# Patient Record
Sex: Male | Born: 1960 | Race: White | Hispanic: No | Marital: Single | Smoking: Never smoker
Health system: Southern US, Community
[De-identification: ages and names within clinical notes are randomized; demographics above are authoritative.]

## PROBLEM LIST (undated history)

## (undated) DIAGNOSIS — S92353A Displaced fracture of fifth metatarsal bone, unspecified foot, initial encounter for closed fracture: Secondary | ICD-10-CM

## (undated) DIAGNOSIS — E785 Hyperlipidemia, unspecified: Secondary | ICD-10-CM

## (undated) HISTORY — PX: TYMPANOPLASTY W/ MASTOIDECTOMY: SUR1400

---

## 2007-07-02 ENCOUNTER — Ambulatory Visit: Payer: Self-pay | Admitting: Gastroenterology

## 2007-07-23 ENCOUNTER — Ambulatory Visit: Payer: Self-pay | Admitting: Gastroenterology

## 2011-04-07 ENCOUNTER — Other Ambulatory Visit: Payer: Self-pay | Admitting: Orthopedic Surgery

## 2011-04-16 MED ORDER — DIPHENHYDRAMINE HCL 50 MG/ML IJ SOLN
INTRAMUSCULAR | Status: AC
  Filled 2011-04-16: qty 1

## 2011-04-16 MED ORDER — MIDAZOLAM HCL 10 MG/2ML IJ SOLN
INTRAMUSCULAR | Status: AC
  Filled 2011-04-16: qty 2

## 2011-04-16 MED ORDER — FENTANYL CITRATE 0.05 MG/ML IJ SOLN
INTRAMUSCULAR | Status: AC
  Filled 2011-04-16: qty 2

## 2011-04-20 ENCOUNTER — Encounter (HOSPITAL_BASED_OUTPATIENT_CLINIC_OR_DEPARTMENT_OTHER): Payer: Self-pay | Admitting: *Deleted

## 2011-04-20 NOTE — Progress Notes (Signed)
To bring crutches-needs ekg if anesth wants No meds

## 2011-04-22 ENCOUNTER — Encounter (HOSPITAL_BASED_OUTPATIENT_CLINIC_OR_DEPARTMENT_OTHER): Payer: Self-pay | Admitting: Anesthesiology

## 2011-04-22 ENCOUNTER — Encounter (HOSPITAL_BASED_OUTPATIENT_CLINIC_OR_DEPARTMENT_OTHER)
Admission: RE | Disposition: A | Discharge status: Home / Self Care | Payer: Self-pay | Source: Ambulatory Visit | Attending: Orthopedic Surgery

## 2011-04-22 ENCOUNTER — Ambulatory Visit (HOSPITAL_BASED_OUTPATIENT_CLINIC_OR_DEPARTMENT_OTHER): Payer: PRIVATE HEALTH INSURANCE | Admitting: Anesthesiology

## 2011-04-22 ENCOUNTER — Encounter (HOSPITAL_BASED_OUTPATIENT_CLINIC_OR_DEPARTMENT_OTHER): Payer: Self-pay | Admitting: *Deleted

## 2011-04-22 ENCOUNTER — Ambulatory Visit (HOSPITAL_BASED_OUTPATIENT_CLINIC_OR_DEPARTMENT_OTHER)
Admission: RE | Admit: 2011-04-22 | Discharge: 2011-04-22 | Disposition: A | Discharge status: Home / Self Care | Payer: PRIVATE HEALTH INSURANCE | Source: Ambulatory Visit | Attending: Orthopedic Surgery | Admitting: Orthopedic Surgery

## 2011-04-22 DIAGNOSIS — IMO0002 Reserved for concepts with insufficient information to code with codable children: Secondary | ICD-10-CM | POA: Insufficient documentation

## 2011-04-22 DIAGNOSIS — Z01812 Encounter for preprocedural laboratory examination: Secondary | ICD-10-CM | POA: Insufficient documentation

## 2011-04-22 DIAGNOSIS — S92353A Displaced fracture of fifth metatarsal bone, unspecified foot, initial encounter for closed fracture: Secondary | ICD-10-CM | POA: Diagnosis present

## 2011-04-22 HISTORY — DX: Displaced fracture of fifth metatarsal bone, unspecified foot, initial encounter for closed fracture: S92.353A

## 2011-04-22 HISTORY — PX: ORIF TOE FRACTURE: SHX5032

## 2011-04-22 HISTORY — DX: Hyperlipidemia, unspecified: E78.5

## 2011-04-22 LAB — POCT HEMOGLOBIN-HEMACUE: Hemoglobin: 16 g/dL (ref 13.0–17.0)

## 2011-04-22 SURGERY — OPEN REDUCTION INTERNAL FIXATION (ORIF) METATARSAL (TOE) FRACTURE
Anesthesia: General | Site: Toe | Laterality: Right

## 2011-04-22 MED ORDER — PROMETHAZINE HCL 25 MG PO TABS: 25.0000 mg | ORAL_TABLET | Freq: Four times a day (QID) | ORAL | Status: AC | PRN

## 2011-04-22 MED ORDER — FENTANYL CITRATE 0.05 MG/ML IJ SOLN
INTRAMUSCULAR | Status: DC | PRN
  Administered 2011-04-22 (×3): 25 ug via INTRAVENOUS
  Administered 2011-04-22: 50 ug via INTRAVENOUS

## 2011-04-22 MED ORDER — ONDANSETRON HCL 4 MG/2ML IJ SOLN
INTRAMUSCULAR | Status: DC | PRN
  Administered 2011-04-22: 4 mg via INTRAVENOUS

## 2011-04-22 MED ORDER — DEXAMETHASONE SODIUM PHOSPHATE 4 MG/ML IJ SOLN
INTRAMUSCULAR | Status: DC | PRN
  Administered 2011-04-22: 10 mg via INTRAVENOUS

## 2011-04-22 MED ORDER — METHOCARBAMOL 500 MG PO TABS: 500.0000 mg | ORAL_TABLET | Freq: Four times a day (QID) | ORAL | Status: AC

## 2011-04-22 MED ORDER — HYDROMORPHONE HCL PF 1 MG/ML IJ SOLN: 0.2500 mg | INTRAMUSCULAR | Status: DC | PRN

## 2011-04-22 MED ORDER — LACTATED RINGERS IV SOLN
INTRAVENOUS | Status: DC
  Administered 2011-04-22 (×2): via INTRAVENOUS

## 2011-04-22 MED ORDER — PROPOFOL 10 MG/ML IV EMUL
INTRAVENOUS | Status: DC | PRN
  Administered 2011-04-22: 200 mg via INTRAVENOUS

## 2011-04-22 MED ORDER — DROPERIDOL 2.5 MG/ML IJ SOLN: 0.6250 mg | INTRAMUSCULAR | Status: DC | PRN

## 2011-04-22 MED ORDER — BUPIVACAINE HCL (PF) 0.5 % IJ SOLN
INTRAMUSCULAR | Status: DC | PRN
  Administered 2011-04-22: 30 mL

## 2011-04-22 MED ORDER — OXYCODONE-ACETAMINOPHEN 10-325 MG PO TABS: 1.0000 | ORAL_TABLET | Freq: Four times a day (QID) | ORAL | Status: AC | PRN

## 2011-04-22 MED ORDER — MIDAZOLAM HCL 5 MG/ML IJ SOLN: 2.0000 mg | Freq: Once | INTRAMUSCULAR | Status: DC

## 2011-04-22 MED ORDER — CHLORHEXIDINE GLUCONATE 4 % EX LIQD: 60.0000 mL | Freq: Once | CUTANEOUS | Status: DC

## 2011-04-22 MED ORDER — MIDAZOLAM HCL 2 MG/2ML IJ SOLN
2.0000 mg | Freq: Once | INTRAMUSCULAR | Status: AC
  Administered 2011-04-22: 2 mg via INTRAVENOUS

## 2011-04-22 MED ORDER — CEFAZOLIN SODIUM 1-5 GM-% IV SOLN
1.0000 g | INTRAVENOUS | Status: AC
  Administered 2011-04-22: 2 g via INTRAVENOUS

## 2011-04-22 MED ORDER — FENTANYL CITRATE 0.05 MG/ML IJ SOLN
100.0000 ug | Freq: Once | INTRAMUSCULAR | Status: AC
  Administered 2011-04-22: 100 ug via INTRAVENOUS

## 2011-04-22 SURGICAL SUPPLY — 79 items
1.8MM X 125MM DRILL BIT ×2 IMPLANT
APL SKNCLS STERI-STRIP NONHPOA (GAUZE/BANDAGES/DRESSINGS)
BANDAGE ACE 4 STERILE (GAUZE/BANDAGES/DRESSINGS) ×2 IMPLANT
BANDAGE ELASTIC 4 VELCRO ST LF (GAUZE/BANDAGES/DRESSINGS) ×2 IMPLANT
BANDAGE ELASTIC 6 VELCRO ST LF (GAUZE/BANDAGES/DRESSINGS) ×2 IMPLANT
BANDAGE ESMARK 6X9 LF (GAUZE/BANDAGES/DRESSINGS) ×1 IMPLANT
BENZOIN TINCTURE PRP APPL 2/3 (GAUZE/BANDAGES/DRESSINGS) IMPLANT
BLADE SURG 15 STRL LF DISP TIS (BLADE) ×3 IMPLANT
BLADE SURG 15 STRL SS (BLADE) ×6
BNDG CMPR 9X6 STRL LF SNTH (GAUZE/BANDAGES/DRESSINGS) ×1
BNDG ESMARK 6X9 LF (GAUZE/BANDAGES/DRESSINGS) ×2
CANISTER SUCTION 1200CC (MISCELLANEOUS) IMPLANT
CAP PIN ORTHO PINK (CAP) ×2 IMPLANT
CAP PIN PROTECTOR ORTHO WHT (CAP) ×2 IMPLANT
CLOTH BEACON ORANGE TIMEOUT ST (SAFETY) ×2 IMPLANT
CUFF TOURNIQUET SINGLE 34IN LL (TOURNIQUET CUFF) ×2 IMPLANT
DECANTER SPIKE VIAL GLASS SM (MISCELLANEOUS) IMPLANT
DRAPE EXTREMITY T 121X128X90 (DRAPE) ×2 IMPLANT
DRAPE OEC MINIVIEW 54X84 (DRAPES) ×4 IMPLANT
DRAPE U-SHAPE 47X51 STRL (DRAPES) ×2 IMPLANT
DRSG EMULSION OIL 3X3 NADH (GAUZE/BANDAGES/DRESSINGS) ×2 IMPLANT
DRSG PAD ABDOMINAL 8X10 ST (GAUZE/BANDAGES/DRESSINGS) ×2 IMPLANT
DURAPREP 26ML APPLICATOR (WOUND CARE) ×4 IMPLANT
ELECT REM PT RETURN 9FT ADLT (ELECTROSURGICAL) ×2
ELECTRODE REM PT RTRN 9FT ADLT (ELECTROSURGICAL) ×1 IMPLANT
GAUZE SPONGE 4X4 12PLY STRL LF (GAUZE/BANDAGES/DRESSINGS) ×2 IMPLANT
GLOVE BIOGEL PI IND STRL 8 (GLOVE) ×1 IMPLANT
GLOVE BIOGEL PI INDICATOR 8 (GLOVE) ×1
GLOVE ORTHO TXT STRL SZ7.5 (GLOVE) ×4 IMPLANT
GLOVE SURG ORTHO 8.0 STRL STRW (GLOVE) ×2 IMPLANT
GOWN PREVENTION PLUS XLARGE (GOWN DISPOSABLE) ×2 IMPLANT
K-WIRE 1.6X150 (WIRE) ×2
KWIRE 1.6X150 (WIRE) ×1 IMPLANT
NEEDLE KEITH (NEEDLE) IMPLANT
NS IRRIG 1000ML POUR BTL (IV SOLUTION) ×2 IMPLANT
PACK ARTHROSCOPY DSU (CUSTOM PROCEDURE TRAY) IMPLANT
PACK BASIN DAY SURGERY FS (CUSTOM PROCEDURE TRAY) ×2 IMPLANT
PAD CAST 4YDX4 CTTN HI CHSV (CAST SUPPLIES) ×1 IMPLANT
PADDING CAST ABS 3INX4YD NS (CAST SUPPLIES) ×1
PADDING CAST ABS 4INX4YD NS (CAST SUPPLIES)
PADDING CAST ABS COTTON 3X4 (CAST SUPPLIES) ×1 IMPLANT
PADDING CAST ABS COTTON 4X4 ST (CAST SUPPLIES) IMPLANT
PADDING CAST COTTON 4X4 STRL (CAST SUPPLIES) ×2
PADDING WEBRIL 3 STERILE (GAUZE/BANDAGES/DRESSINGS) ×2 IMPLANT
PADDING WEBRIL 4 STERILE (GAUZE/BANDAGES/DRESSINGS) ×2 IMPLANT
PENCIL BUTTON HOLSTER BLD 10FT (ELECTRODE) ×2 IMPLANT
PLATE CONDYLAR 2.4 8H 57M R (Plate) ×2 IMPLANT
SCREW CORTEX 2.4 10MM (Screw) ×2 IMPLANT
SCREW CORTEX 2.4 14MM (Screw) ×2 IMPLANT
SCREW CORTEX 2.4 16MM (Screw) ×2 IMPLANT
SCREW CORTEX 2.4X12 (Screw) ×6 IMPLANT
SLEEVE SCD COMPRESS KNEE MED (MISCELLANEOUS) ×2 IMPLANT
SPLINT FAST PLASTER 5X30 (CAST SUPPLIES) ×11
SPLINT PLASTER CAST FAST 5X30 (CAST SUPPLIES) ×11 IMPLANT
SPONGE GAUZE 4X4 12PLY (GAUZE/BANDAGES/DRESSINGS) ×2 IMPLANT
SPONGE LAP 4X18 X RAY DECT (DISPOSABLE) IMPLANT
STAPLER VISISTAT 35W (STAPLE) IMPLANT
STRIP CLOSURE SKIN 1/2X4 (GAUZE/BANDAGES/DRESSINGS) IMPLANT
SUCTION FRAZIER TIP 10 FR DISP (SUCTIONS) ×2 IMPLANT
SUT ETHILON 3 0 PS 1 (SUTURE) IMPLANT
SUT ETHILON 4 0 PS 2 18 (SUTURE) ×2 IMPLANT
SUT FIBERWIRE #2 38 T-5 BLUE (SUTURE)
SUT FIBERWIRE #5 38 CONV NDL (SUTURE)
SUT MNCRL AB 4-0 PS2 18 (SUTURE) IMPLANT
SUT VIC AB 0 CT1 27 (SUTURE)
SUT VIC AB 0 CT1 27XBRD ANBCTR (SUTURE) IMPLANT
SUT VIC AB 2-0 SH 18 (SUTURE) IMPLANT
SUT VIC AB 2-0 SH 27 (SUTURE)
SUT VIC AB 2-0 SH 27XBRD (SUTURE) IMPLANT
SUT VIC AB 3-0 SH 27 (SUTURE) ×2
SUT VIC AB 3-0 SH 27X BRD (SUTURE) ×1 IMPLANT
SUT VICRYL 3-0 CR8 SH (SUTURE) IMPLANT
SUT VICRYL 4-0 PS2 18IN ABS (SUTURE) IMPLANT
SUTURE FIBERWR #2 38 T-5 BLUE (SUTURE) IMPLANT
SUTURE FIBERWR #5 38 CONV NDL (SUTURE) IMPLANT
SYR BULB 3OZ (MISCELLANEOUS) ×2 IMPLANT
UNDERPAD 30X30 INCONTINENT (UNDERPADS AND DIAPERS) ×2 IMPLANT
WATER STERILE IRR 1000ML POUR (IV SOLUTION) ×2 IMPLANT
YANKAUER SUCT BULB TIP NO VENT (SUCTIONS) IMPLANT

## 2011-04-22 NOTE — Anesthesia Procedure Notes (Addendum)
Anesthesia Regional Block:  Popliteal block  Pre-Anesthetic Checklist: ,, timeout performed, Correct Patient, Correct Site, Correct Laterality, Correct Procedure,, site marked, risks and benefits discussed, Surgical consent,  Pre-op evaluation,  At surgeon's request and post-op pain management  Laterality: Right  Prep: chloraprep       Needles:  Injection technique: Single-shot  Needle Type: Echogenic Stimulator Needle          Additional Needles:  Procedures: ultrasound guided and nerve stimulator Popliteal block  Nerve Stimulator or Paresthesia:  Response: plantar flexion, 0.45 mA,   Additional Responses:   Narrative:  Start time: 04/22/2011 7:12 AM End time: 04/22/2011 7:21 AM  Performed by: Personally  Anesthesiologist: J. Adonis Huguenin, MD  Additional Notes: A functioning IV was confirmed and monitors were applied.  Sterile prep and drape, hand hygiene and sterile gloves were used.  Negative aspiration and test dose prior to incremental administration of local anesthetic. The patient tolerated the procedure well. Ultrasound guidance: relevent anatomy identified, needle position confirmed, local anesthetic spread visualized around nerve(s), vascular puncture avoided.  Image printed for medical record.   Popliteal block Procedure Name: LMA Insertion Date/Time: 04/22/2011 7:44 AM Performed by: Zenia Resides D Pre-anesthesia Checklist: Patient identified, Emergency Drugs available, Suction available and Patient being monitored Patient Re-evaluated:Patient Re-evaluated prior to inductionOxygen Delivery Method: Circle System Utilized Preoxygenation: Pre-oxygenation with 100% oxygen Intubation Type: IV induction Ventilation: Mask ventilation without difficulty LMA: LMA with gastric port inserted LMA Size: 5.0 Number of attempts: 1 Placement Confirmation: positive ETCO2 and breath sounds checked- equal and bilateral Tube secured with: Tape Dental Injury: Teeth and  Oropharynx as per pre-operative assessment

## 2011-04-22 NOTE — H&P (Signed)
Scott Dalton is an 50 y.o. male.   Chief Complaint: right 5th mt pain HPI: 3 weeks after injury to r 5th MT, progressive malposition, pain, over lateral r foot.  Past Medical History  Diagnosis Date  . Hyperlipidemia     Past Surgical History  Procedure Date  . Tympanoplasty w/ mastoidectomy     History reviewed. No pertinent family history. Social History:  does not have a smoking history on file. He does not have any smokeless tobacco history on file. He reports that he drinks alcohol. He reports that he does not use illicit drugs.  Allergies: No Known Allergies  Medications Prior to Admission  Medication Dose Route Frequency Provider Last Rate Last Dose  . ceFAZolin (ANCEF) IVPB 1 g/50 mL premix  1 g Intravenous 60 min Pre-Op Taira Knabe P Shaya Reddick      . chlorhexidine (HIBICLENS) 4 % liquid 4 application  60 mL Topical Once Burtis Junes Verlyn Lambert      . fentaNYL (SUBLIMAZE) injection 100 mcg  100 mcg Intravenous Once Remonia Richter, MD      . lactated ringers infusion   Intravenous Continuous Zenon Mayo, MD 20 mL/hr at 04/22/11 [phone]    . midazolam (VERSED) injection 2 mg  2 mg Intravenous Once Remonia Richter, MD      . DISCONTD: diphenhydrAMINE (BENADRYL) 50 MG/ML injection           . DISCONTD: fentaNYL (SUBLIMAZE) 0.05 MG/ML injection           . DISCONTD: midazolam (VERSED) 10 MG/2ML injection            Medications Prior to Admission  Medication Sig Dispense Refill  . aspirin 81 MG chewable tablet Chew 81 mg by mouth daily.        . Multiple Vitamin (MULTIVITAMIN) capsule Take 1 capsule by mouth daily.        . simvastatin (ZOCOR) 20 MG tablet Take 20 mg by mouth at bedtime.          Results for orders placed during the hospital encounter of 04/22/11 (from the past 48 hour(s))  POCT HEMOGLOBIN-HEMACUE     Status: Normal   Collection Time   04/22/11  6:58 AM      Component Value Range Comment   Hemoglobin 16.0  13.0 - 17.0 (g/dL)    No results  found.  Review of Systems  All other systems reviewed and are negative.    Blood pressure 130/90, pulse 62, temperature 97.8 F (36.6 C), temperature source Oral, resp. rate 16, height 5\' 11"  (1.803 m), weight 90.719 kg (200 lb), SpO2 98.00%. Physical Exam  Constitutional: He appears well-developed and well-nourished.  HENT:  Head: Normocephalic and atraumatic.  Eyes: EOM are normal.  Neck: Neck supple.  Respiratory: Effort normal.  GI: Soft.  Neurological: He is alert.  Skin: Skin is warm and dry.  Psychiatric: He has a normal mood and affect.     Assessment/Plan R 5th MT fx, to OR for ORIF.   Adryan Shin P 04/22/2011, 7:29 AM

## 2011-04-22 NOTE — Transfer of Care (Signed)
Immediate Anesthesia Transfer of Care Note  Patient: Scott Dalton  Procedure(s) Performed:  OPEN REDUCTION INTERNAL FIXATION (ORIF) METATARSAL (TOE) FRACTURE - RIGHT V TOE FRACTURE OPEN TREATMENT METATARSAL INCLUDES INTERNAL FIXATION  Patient Location: PACU  Anesthesia Type: General  Level of Consciousness: awake, alert  and oriented  Airway & Oxygen Therapy: Patient Spontanous Breathing and Patient connected to face mask oxygen  Post-op Assessment: Report given to PACU RN and Post -op Vital signs reviewed and stable  Post vital signs: Reviewed and stable  Complications: No apparent anesthesia complications

## 2011-04-22 NOTE — Op Note (Signed)
04/22/2011  10:11 AM  PATIENT:  Scott Dalton    PRE-OPERATIVE DIAGNOSIS:  RIGHT V TOE METARSAL TOE  POST-OPERATIVE DIAGNOSIS:  Same  PROCEDURE:  OPEN REDUCTION INTERNAL FIXATION (ORIF) METATARSAL FRACTURE  SURGEON:  Hodari Chuba P  ANESTHESIA:   General  PREOPERATIVE INDICATIONS:  Scott Dalton is a  50 y.o. male with a diagnosis of RIGHT V TOE METARSAL TOE who failed conservative measures and elected for surgical management.  He had a displaced fracture, the progressively got more displaced over the course of time. He had angulation, and deformity, and a significant potential for nonunion. For these reasons, he elected for surgical management.  The risks benefits and alternatives were discussed with the patient preoperatively including but not limited to the risks of infection, bleeding, nerve injury, cardiopulmonary complications, the need for revision surgery, among others, and the patient was willing to proceed.  OPERATIVE IMPLANTS: Synthes modular foot nonlocking T. 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 General anesthesia was administered. A regional block targeting given. The right lower extremity was prepped and draped in usual sterile fashion. Incision was made lateral on the border of the fifth metatarsal. The leg was elevated and exsanguinated prior to incision comment time out was also performed. Total tourniquet time was approximately 90 minutes.  Blunt dissection was carried down below the level of the skin in order to minimize the risk for nerve injury. The fracture site was identified, and cleaned, and callus removed. I reduced the fracture is anatomically as possible, however it was fairly comminuted, and the bone fragments had consolidated in a nonanatomic way dorsally. Nonetheless, I  did achieve satisfactory reduction, and then held this provisionally with a clamp.  A percutaneous K wire was used provisionally to hold fixation while I selected the appropriate length plate.  Near-anatomic alignment was achieved. I selected a plate, and cut the holes proximally removing 2 holes in order to get the appropriate length.  At then secured the plate proximally with a single interlocking screw that was bicortical. This provided excellent fixation. I placed 2 distal screws into the head of the metatarsal, which did not have great bite. Nonetheless, did confirm that the plate was directly in line with the metatarsal head, and I had as good fixation as possible. I did secured with one more screw distally, and this provided better fixation, and then completed the fixation proximally with cortical screw fixation.  Because of is not completely happy with the purchase of the distal screws, I left the K wire in place, bent this, cut it, and then delivered through the skin. The wounds were irrigated copiously and final C-arm pictures were taken in the deep tissues closed with Vicryl followed by nylon for the skin. Sterile gauze was applied as well as a splint, and the patient had the tourniquet released and he was awakened and returned back in stable and satisfactory condition. There were no complications and he tolerated the procedure well.  He should be nonweightbearing, with strict elevation, for approximately 6 weeks.

## 2011-04-22 NOTE — Anesthesia Preprocedure Evaluation (Signed)
Anesthesia Evaluation  Patient identified by MRN, date of birth, ID band Patient awake    Reviewed: Allergy & Precautions, H&P , NPO status , Patient's Chart, lab work & pertinent test results  Airway       Dental   Pulmonary neg pulmonary ROS,  clear to auscultation  Pulmonary exam normal       Cardiovascular neg cardio ROS regular Normal- Systolic murmurs    Neuro/Psych    GI/Hepatic negative GI ROS, Neg liver ROS,   Endo/Other  Negative Endocrine ROS  Renal/GU      Musculoskeletal   Abdominal   Peds  Hematology   Anesthesia Other Findings   Reproductive/Obstetrics                           Anesthesia Physical Anesthesia Plan  ASA: I  Anesthesia Plan: General   Post-op Pain Management: MAC Combined w/ Regional for Post-op pain   Induction:   Airway Management Planned:   Additional Equipment:   Intra-op Plan:   Post-operative Plan:   Informed Consent: I have reviewed the patients History and Physical, chart, labs and discussed the procedure including the risks, benefits and alternatives for the proposed anesthesia with the patient or authorized representative who has indicated his/her understanding and acceptance.     Plan Discussed with: CRNA and Surgeon  Anesthesia Plan Comments:         Anesthesia Quick Evaluation

## 2011-04-22 NOTE — Procedures (Signed)
Assisted Dr. Singer with right, ultrasound guided, popliteal block. Side rails up, monitors on throughout procedure. See vital signs in flow sheet. Tolerated Procedure well.  

## 2011-04-22 NOTE — Anesthesia Postprocedure Evaluation (Signed)
Anesthesia Post Note  Patient: Scott Dalton  Procedure(s) Performed:  OPEN REDUCTION INTERNAL FIXATION (ORIF) METATARSAL (TOE) FRACTURE - RIGHT V TOE FRACTURE OPEN TREATMENT METATARSAL INCLUDES INTERNAL FIXATION  Anesthesia type: General  Patient location: PACU  Post pain: Pain level controlled  Post assessment: Patient's Cardiovascular Status Stable  Last Vitals:  Filed Vitals:   04/22/11 1125  BP: 132/89  Pulse: 98  Temp: 36.3 C  Resp: 16    Post vital signs: Reviewed and stable  Level of consciousness: sedated  Complications: No apparent anesthesia complications

## 2011-04-29 ENCOUNTER — Encounter (HOSPITAL_BASED_OUTPATIENT_CLINIC_OR_DEPARTMENT_OTHER): Payer: Self-pay | Admitting: Orthopedic Surgery

## 2011-12-30 ENCOUNTER — Ambulatory Visit
Admission: RE | Admit: 2011-12-30 | Discharge: 2011-12-30 | Disposition: A | Discharge status: Home / Self Care | Payer: BC Managed Care – PPO | Source: Ambulatory Visit | Attending: Physician Assistant | Admitting: Physician Assistant

## 2011-12-30 ENCOUNTER — Other Ambulatory Visit: Payer: Self-pay | Admitting: Physician Assistant

## 2011-12-30 DIAGNOSIS — R05 Cough: Secondary | ICD-10-CM

## 2012-03-16 ENCOUNTER — Institutional Professional Consult (permissible substitution): Payer: BC Managed Care – PPO | Admitting: Cardiology

## 2012-04-04 ENCOUNTER — Ambulatory Visit (INDEPENDENT_AMBULATORY_CARE_PROVIDER_SITE_OTHER): Payer: BC Managed Care – PPO | Admitting: Cardiology

## 2012-04-04 ENCOUNTER — Encounter: Payer: Self-pay | Admitting: Cardiology

## 2012-04-04 VITALS — BP 132/85 | HR 62 | Ht 70.0 in | Wt 205.6 lb

## 2012-04-04 DIAGNOSIS — R072 Precordial pain: Secondary | ICD-10-CM | POA: Insufficient documentation

## 2012-04-04 NOTE — Progress Notes (Signed)
HPI The patient presents for evaluation of chest discomfort. He has a past history of chest pain describing a stress perfusion study several years ago that apparently was negative. He does have a family history of coronary disease as described below. He says he has been getting chest discomfort with increasing frequency over several months. He points to a left-sided and central burning chest discomfort. There is no radiation to his arms or to his jaw. He does not describe associated symptoms such as not see a vomiting or diaphoresis. He thinks it somewhat similar to what he had before. He's never had reflux so he doesn't know if this could be the symptom. He does not describe shortness of breath, PND or orthopnea. He has no palpitations, presyncope or syncope. The discomfort is not made worse by movement or deep breathing or positions. He can exercise vigorously without bringing it on. It is 4/10 in intensity.  No Known Allergies  Current Outpatient Prescriptions  Medication Sig Dispense Refill  . aspirin 81 MG chewable tablet Chew 81 mg by mouth daily.        . Multiple Vitamin (MULTIVITAMIN) capsule Take 1 capsule by mouth daily.        . simvastatin (ZOCOR) 20 MG tablet Take 20 mg by mouth at bedtime.          Past Medical History  Diagnosis Date  . Hyperlipidemia   . Displaced fracture of fifth metatarsal bone 04/22/2011    Past Surgical History  Procedure Date  . Tympanoplasty w/ mastoidectomy   . Orif toe fracture 04/22/2011    Procedure: OPEN REDUCTION INTERNAL FIXATION (ORIF) METATARSAL (TOE) FRACTURE;  Surgeon: Eulas Post;  Location: [facility];  Service: Orthopedics;  Laterality: Right;  RIGHT V TOE FRACTURE OPEN TREATMENT METATARSAL INCLUDES INTERNAL FIXATION    Family History  Problem Relation Age of Onset  . Diabetes Father 66  . CAD Father   . CAD Sister 98    History   Social History  . Marital Status: Single    Spouse Name: N/A    Number of  Children: 2  . Years of Education: N/A   Occupational History  .     Social History Main Topics  . Smoking status: Never Smoker   . Smokeless tobacco: Not on file  . Alcohol Use: Yes  . Drug Use: No  . Sexually Active: Not on file   Other Topics Concern  . Not on file   Social History Narrative  . No narrative on file    ROS:  Positive for occasional lightheadedness.  Otherwise as stated in the HPI and negative for all other systems.  PHYSICAL EXAM BP 132/85  Pulse 62  Ht 5\' 10"  (1.778 m)  Wt 205 lb 9.6 oz (93.26 kg)  BMI 29.50 kg/m2 GENERAL:  Well appearing HEENT:  Pupils equal round and reactive, fundi not visualized, oral mucosa unremarkable NECK:  No jugular venous distention, waveform within normal limits, carotid upstroke brisk and symmetric, no bruits, no thyromegaly LYMPHATICS:  No cervical, inguinal adenopathy LUNGS:  Clear to auscultation bilaterally BACK:  No CVA tenderness CHEST:  Unremarkable HEART:  PMI not displaced or sustained,S1 and S2 within normal limits, no S3, no S4, no clicks, no rubs, no murmurs ABD:  Flat, positive bowel sounds normal in frequency in pitch, no bruits, no rebound, no guarding, no midline pulsatile mass, no hepatomegaly, no splenomegaly EXT:  2 plus pulses throughout, no edema, no cyanosis no clubbing SKIN:  No rashes no nodules NEURO:  Cranial nerves II through XII grossly intact, motor grossly intact throughout PSYCH:  Cognitively intact, oriented to person place and time  EKG:  Sinus rhythm, rate 62, axis within normal limits, intervals within normal limits, no acute ST-T wave changes. 04/04/2012   ASSESSMENT AND PLAN   CHEST PAIN - The chest discomfort is somewhat atypical. However, he has a significant family history.  I will bring the patient back for a POET (Plain Old Exercise Test). This will allow me to screen for obstructive coronary disease, risk stratify and very importantly provide a prescription for exercise.  I  would also offer him a coronary CT calcium score to quantify the degree of calcium which has been demonstrated to have some prognostic significance and can help guide preventive therapy.  HYPERLIPIDEMIA -  His last lipid profile demonstrated an LDL of 95 HDL 44. This is acceptable and he will continue the meds as listed.  OVERWEIGHT - He is overweight but we discussed more importantly fitness. He has a reasonable level of fitness but we discussed at length diet and exercise.

## 2012-04-04 NOTE — Patient Instructions (Addendum)
The current medical regimen is effective;  continue present plan and medications.  Your physician has requested that you have an exercise tolerance test. For further information please visit [email or web address] Please also follow instruction sheet, as given.  Your are being scheduled for a Coronary Calcium Score to be done here in this office. There are no restrictions prior to this testing.

## 2012-04-09 ENCOUNTER — Ambulatory Visit (INDEPENDENT_AMBULATORY_CARE_PROVIDER_SITE_OTHER)
Admission: RE | Admit: 2012-04-09 | Discharge: 2012-04-09 | Disposition: A | Discharge status: Home / Self Care | Payer: BC Managed Care – PPO | Source: Ambulatory Visit | Attending: Cardiology | Admitting: Cardiology

## 2012-04-09 ENCOUNTER — Ambulatory Visit (INDEPENDENT_AMBULATORY_CARE_PROVIDER_SITE_OTHER): Payer: BC Managed Care – PPO | Admitting: Physician Assistant

## 2012-04-09 DIAGNOSIS — R072 Precordial pain: Secondary | ICD-10-CM

## 2012-04-09 DIAGNOSIS — R079 Chest pain, unspecified: Secondary | ICD-10-CM

## 2012-04-09 NOTE — Progress Notes (Signed)
Exercise Treadmill Test  Pre-Exercise Testing Evaluation Rhythm: normal sinus  Rate: 69   PR:  .19 QRS:  .08  QT:  .39 QTc: .42           Test  Exercise Tolerance Test Ordering MD: Angelina Sheriff, MD  Interpreting MD: Tereso Newcomer, PA-C  Unique Test No: 1  Treadmill:  1  Indication for ETT: chest pain - rule out ischemia  Contraindication to ETT: No   Stress Modality: exercise - treadmill  Cardiac Imaging Performed: non   Protocol: standard Bruce - maximal  Max BP:  176/77  Max MPHR (bpm):  168 85% MPR (bpm):  143  MPHR obtained (bpm):  171 % MPHR obtained:  101%  Reached 85% MPHR (min:sec):  6:31 Total Exercise Time (min-sec):  10:00  Workload in METS:  11.6 Borg Scale: 16  Reason ETT Terminated:  desired heart rate attained    ST Segment Analysis At Rest: normal ST segments - no evidence of significant ST depression With Exercise: no evidence of significant ST depression  Other Information Arrhythmia:  No Angina during ETT:  absent (0) Quality of ETT:  diagnostic  ETT Interpretation:  normal - no evidence of ischemia by ST analysis  Comments: Good exercise tolerance. No chest pain. Normal BP response to exercise. No ST-T changes to suggest ischemia.  Recommendations: Follow up with Dr. Rollene Rotunda as directed. Luna Glasgow, PA-C  9:35 AM 04/09/2012

## 2012-05-23 HISTORY — PX: COLONOSCOPY: SHX174

## 2012-06-08 ENCOUNTER — Encounter: Payer: Self-pay | Admitting: Gastroenterology

## 2012-06-13 ENCOUNTER — Encounter: Payer: Self-pay | Admitting: Gastroenterology

## 2012-07-03 ENCOUNTER — Ambulatory Visit (AMBULATORY_SURGERY_CENTER): Payer: BC Managed Care – PPO | Admitting: *Deleted

## 2012-07-03 ENCOUNTER — Encounter: Payer: Self-pay | Admitting: Gastroenterology

## 2012-07-03 VITALS — Ht 71.0 in | Wt 209.8 lb

## 2012-07-03 DIAGNOSIS — Z1211 Encounter for screening for malignant neoplasm of colon: Secondary | ICD-10-CM

## 2012-07-03 MED ORDER — MOVIPREP 100 G PO SOLR: ORAL | Status: DC

## 2012-07-19 ENCOUNTER — Encounter: Payer: Self-pay | Admitting: Gastroenterology

## 2012-07-19 ENCOUNTER — Ambulatory Visit (AMBULATORY_SURGERY_CENTER): Payer: BC Managed Care – PPO | Admitting: Gastroenterology

## 2012-07-19 VITALS — BP 120/82 | HR 54 | Temp 98.7°F | Resp 16 | Ht 71.0 in | Wt 209.0 lb

## 2012-07-19 DIAGNOSIS — Z860100 Personal history of colon polyps, unspecified: Secondary | ICD-10-CM

## 2012-07-19 DIAGNOSIS — Z1211 Encounter for screening for malignant neoplasm of colon: Secondary | ICD-10-CM

## 2012-07-19 DIAGNOSIS — D126 Benign neoplasm of colon, unspecified: Secondary | ICD-10-CM

## 2012-07-19 MED ORDER — SODIUM CHLORIDE 0.9 % IV SOLN: 500.0000 mL | INTRAVENOUS | Status: DC

## 2012-07-19 NOTE — Patient Instructions (Signed)
YOU HAD AN ENDOSCOPIC PROCEDURE TODAY AT THE [facility]: Refer to the procedure report that was given to you for any specific questions about what was found during the examination.  If the procedure report does not answer your questions, please call your gastroenterologist to clarify.  If you requested that your care partner not be given the details of your procedure findings, then the procedure report has been included in a sealed envelope for you to review at your convenience later.  YOU SHOULD EXPECT: Some feelings of bloating in the abdomen. Passage of more gas than usual.  Walking can help get rid of the air that was put into your GI tract during the procedure and reduce the bloating. If you had a lower endoscopy (such as a colonoscopy or flexible sigmoidoscopy) you may notice spotting of blood in your stool or on the toilet paper. If you underwent a bowel prep for your procedure, then you may not have a normal bowel movement for a few days.  DIET: Your first meal following the procedure should be a light meal and then it is ok to progress to your normal diet.  A half-sandwich or bowl of soup is an example of a good first meal.  Heavy or fried foods are harder to digest and may make you feel nauseous or bloated.  Likewise meals heavy in dairy and vegetables can cause extra gas to form and this can also increase the bloating.  Drink plenty of fluids but you should avoid alcoholic beverages for 24 hours.  ACTIVITY: Your care partner should take you home directly after the procedure.  You should plan to take it easy, moving slowly for the rest of the day.  You can resume normal activity the day after the procedure however you should NOT DRIVE or use heavy machinery for 24 hours (because of the sedation medicines used during the test).    SYMPTOMS TO REPORT IMMEDIATELY: A gastroenterologist can be reached at any hour.  During normal business hours, 8:30 AM to 5:00 PM Monday through Friday,  call [phone].  After hours and on weekends, please call the GI answering service at [phone] who will take a message and have the physician on call contact you.   Following lower endoscopy (colonoscopy or flexible sigmoidoscopy):  Excessive amounts of blood in the stool  Significant tenderness or worsening of abdominal pains  Swelling of the abdomen that is new, acute  Fever of 100F or higher    FOLLOW UP: If any biopsies were taken you will be contacted by phone or by letter within the next 1-3 weeks.  Call your gastroenterologist if you have not heard about the biopsies in 3 weeks.  Our staff will call the home number listed on your records the next business day following your procedure to check on you and address any questions or concerns that you may have at that time regarding the information given to you following your procedure. This is a courtesy call and so if there is no answer at the home number and we have not heard from you through the emergency physician on call, we will assume that you have returned to your regular daily activities without incident.  SIGNATURES/CONFIDENTIALITY: You and/or your care partner have signed paperwork which will be entered into your electronic medical record.  These signatures attest to the fact that that the information above on your After Visit Summary has been reviewed and is understood.  Full responsibility of the confidentiality   of this discharge information lies with you and/or your care-partner.     

## 2012-07-19 NOTE — Progress Notes (Signed)
Called to room to assist during endoscopic procedure.  Patient ID and intended procedure confirmed with present staff. Received instructions for my participation in the procedure from the performing physician.  

## 2012-07-19 NOTE — Progress Notes (Signed)
A/ox3 pleased with MAC report to Karen RN 

## 2012-07-19 NOTE — Op Note (Signed)
[facility] [address] [place], [address]   COLONOSCOPY PROCEDURE REPORT  PATIENT: Scott Dalton, Scott Dalton  MR#: [id number] BIRTHDATE: [birth year] , 51  yrs. old GENDER: Male ENDOSCOPIST: Meryl Dare, MD, [facility] PROCEDURE DATE:  07/19/2012 PROCEDURE:   Colonoscopy with biopsy ASA CLASS:   Class II INDICATIONS:Patient's personal history of adenomatous colon polyps.  MEDICATIONS: MAC sedation, administered by CRNA and propofol (Diprivan) 260mg  IV DESCRIPTION OF PROCEDURE:   After the risks benefits and alternatives of the procedure were thoroughly explained, informed consent was obtained.  A digital rectal exam revealed no abnormalities of the rectum.   The LB [id number]  endoscope was introduced through the anus and advanced to the cecum, which was identified by both the appendix and ileocecal valve. No adverse events experienced.   The quality of the prep was excellent, using MoviPrep  The instrument was then slowly withdrawn as the colon was fully examined.  COLON FINDINGS: A sessile polyp measuring 4 mm in size was found in the transverse colon.  A polypectomy was performed with cold forceps.  The resection was complete and the polyp tissue was completely retrieved.   Mild diverticulosis was noted in the sigmoid colon.   The colon was otherwise normal.  There was no diverticulosis, inflammation, polyps or cancers unless previously stated.  Retroflexed views revealed no abnormalities. The time to cecum=1 minutes 49 seconds.  Withdrawal time=9 minutes 09 seconds. The scope was withdrawn and the procedure completed.  COMPLICATIONS: There were no complications.  ENDOSCOPIC IMPRESSION: 1.   Sessile polyp measuring 4 mm in the transverse colon; polypectomy performed with cold forceps 2.   Mild diverticulosis was noted in the sigmoid colon  RECOMMENDATIONS: 1.  Await pathology results 2.  High fiber diet with liberal fluid intake. 3.  Repeat  Colonoscopy in 5 years.  eSigned:  Meryl Dare, MD, [facility] 07/19/2012 11:43 AM   cc: Holley Bouche, MD

## 2012-07-20 ENCOUNTER — Telehealth: Payer: Self-pay | Admitting: *Deleted

## 2012-07-20 NOTE — Telephone Encounter (Signed)
  Follow up Call-  Call back number 07/19/2012  Post procedure Call Back phone  # [phone]  Permission to leave phone message Yes     Patient questions:  Do you have a fever, pain , or abdominal swelling? no Pain Score  0 *  Have you tolerated food without any problems? yes  Have you been able to return to your normal activities? yes  Do you have any questions about your discharge instructions: Diet   no Medications  no Follow up visit  no  Do you have questions or concerns about your Care? no  Actions: * If pain score is 4 or above: No action needed, pain <4.

## 2012-07-25 ENCOUNTER — Encounter: Payer: Self-pay | Admitting: Gastroenterology

## 2013-12-14 IMAGING — CT CT HEART SCORING
1 of 2 series · 8 of 20 positions shown, 10 images · non-contrast
Comparison: Chest radiographs dated 12/30/2011

***ADDENDUM*** CREATED: 04/09/2012 [DATE]

CARDIAC CTA WITH CALCIUM SCORE 04/09/2012 [DATE]
Ordering Physician: YASUCHIKA
Man Gh Physician: Guivin.Christianne
PROTOCOL: The patient scanned on a Siemens 16 slice scanner.
After an initial AP and lateral topogram, 3 mm axial slices were
performed through the heart for calcium scoring.
Indications: cardiac risk factors
INDICATION: Risk stratification
PROTOCOL: The patient was scanned on a Siemens Sensation 16 slice
scanner.  3mm axial non-contrast slices were done through the heart
The data set was analyzed on a dedicated [employer] work station.
Scoring was done using the Agatson method

[Series 3: calcium score · axial · 0.38mm/px · z∈[-250,-166]mm · 8 of 36 slices shown, 10 images]
[im 4/36  vessel]
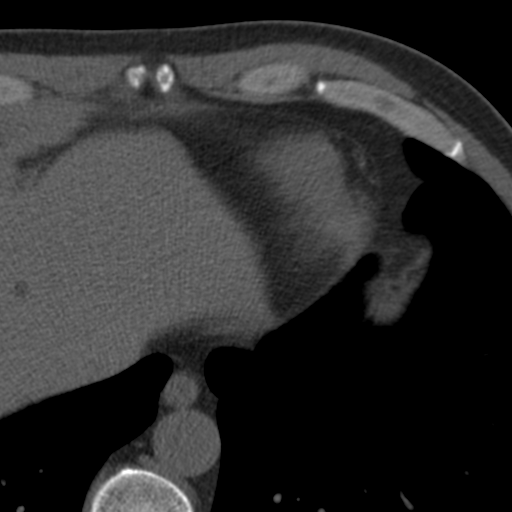
[im 4/36  lung]
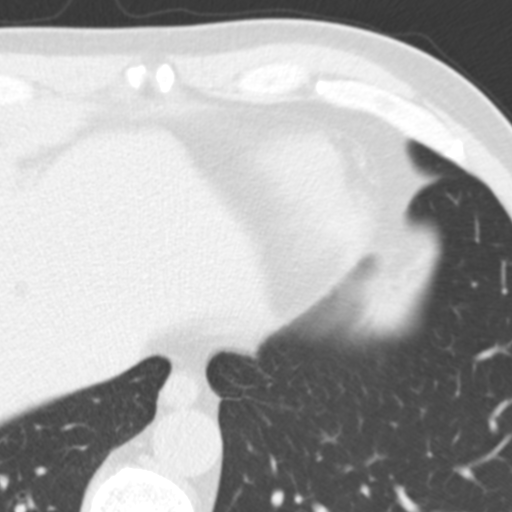
[im 8/36  vessel]
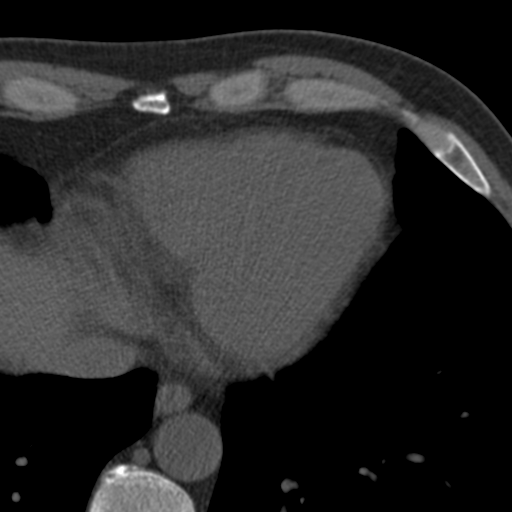
[im 12/36  vessel]
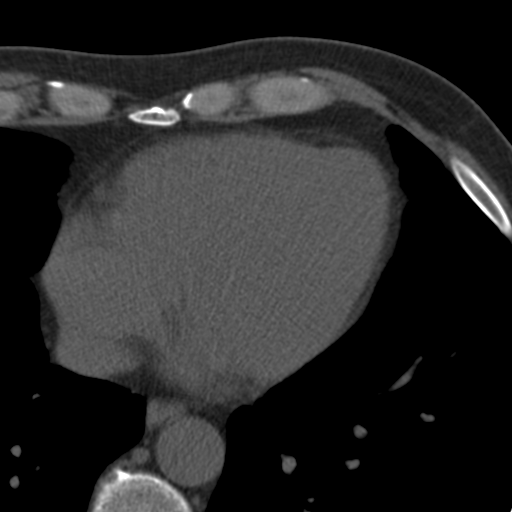
[im 16/36  vessel]
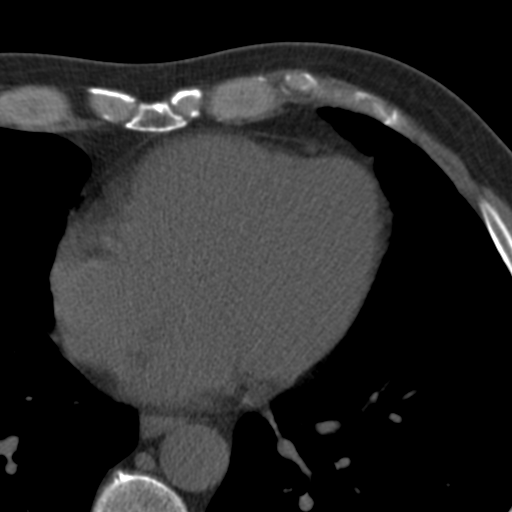
[im 20/36  vessel]
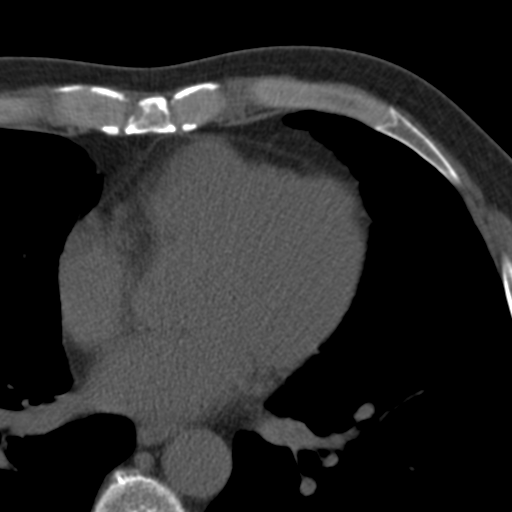
[im 20/36  lung]
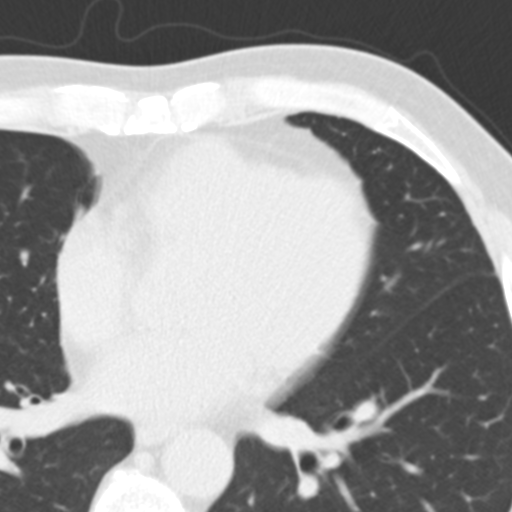
[im 24/36  vessel]
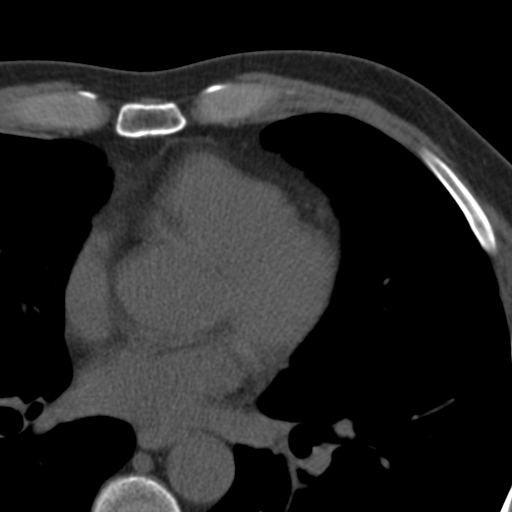
[im 28/36  vessel]
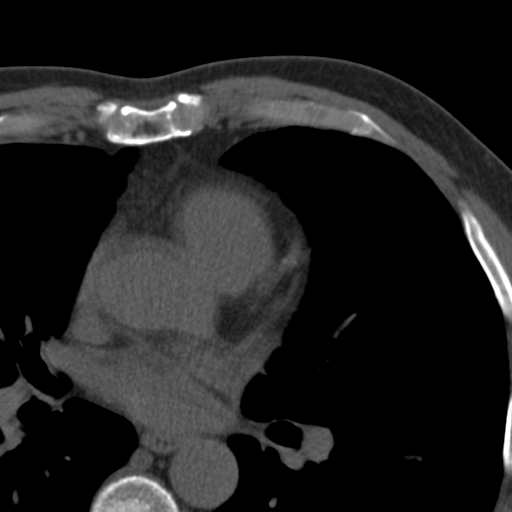
[im 32/36  vessel]
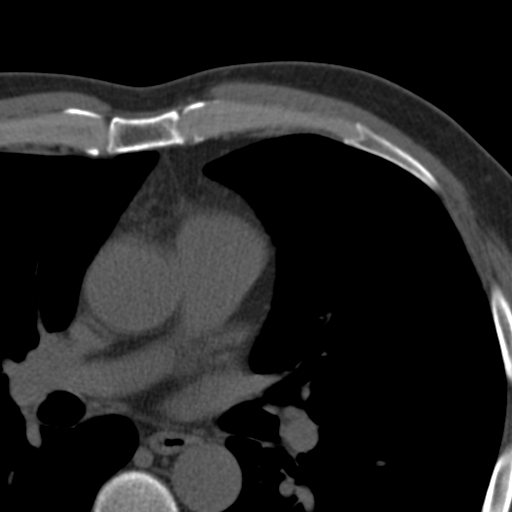

[8 of 20 positions shown; findings below may reference images not displayed]

FINDINGS: Quality of Study: Good

Coronary Calcium Score: 0 Agatston units
IMPRESSION: Coronary artery calcium score of 0 Agatston units places patient in
a low risk category for future cardiac events.

***END ADDENDUM*** SIGNED BY: Kedai Oui
***ADDENDUM*** CREATED: 04/10/2012 [DATE]

OVER-READ INTERPRETATION - CT CHEST

The following report is an over-read performed by radiologist Dr.
[DATE].  This over-read does not include interpretation of
cardiac or coronary anatomy or pathology.  The coronary calcium
score interpretation by the cardiologist is attached.
FINDINGS: 6 mm right lower lobe pulmonary nodule (series 4/image
26).  The visualized lungs are otherwise clear. No pleural effusion
or pneumothorax.

No mediastinal lymphadenopathy.

Mild degenerative changes of the visualized thoracic spine.
IMPRESSION: 6 mm right lower lobe pulmonary nodule.

If this patient is low risk for primary bronchogenic neoplasm, a
single follow-up CT chest is suggested in 12 months.  Otherwise,
initial follow-up CT chest is suggested in 6-12 months.

This recommendation follows the consensus statement: Guidelines for
Management of Small Pulmonary Nodules Detected on CT Scans: A
Statement from the [HOSPITAL] as published in Radiology
1886; [DATE].

***END ADDENDUM*** SIGNED BY: Quirijn Amazigh, M.D.
Cardiac Calcium Score:
FINDINGS: Right dominant coronary arteries

No coronary calcium

Normal pericardium

Ascneding Aorta measures 3.6 cm at RPA
IMPRESSION: Coronary Calcium Score of 0

## 2014-01-06 ENCOUNTER — Other Ambulatory Visit: Payer: Self-pay | Admitting: Family Medicine

## 2014-01-06 ENCOUNTER — Ambulatory Visit
Admission: RE | Admit: 2014-01-06 | Discharge: 2014-01-06 | Disposition: A | Discharge status: Home / Self Care | Payer: BC Managed Care – PPO | Source: Ambulatory Visit | Attending: Family Medicine | Admitting: Family Medicine

## 2014-01-06 DIAGNOSIS — R05 Cough: Secondary | ICD-10-CM

## 2014-01-06 DIAGNOSIS — R059 Cough, unspecified: Secondary | ICD-10-CM

## 2015-10-20 DIAGNOSIS — H7010 Chronic mastoiditis, unspecified ear: Secondary | ICD-10-CM | POA: Insufficient documentation

## 2016-11-16 DIAGNOSIS — H9312 Tinnitus, left ear: Secondary | ICD-10-CM | POA: Insufficient documentation

## 2017-07-17 ENCOUNTER — Encounter: Payer: Self-pay | Admitting: Gastroenterology

## 2017-08-21 HISTORY — PX: COLONOSCOPY: SHX174

## 2017-08-28 ENCOUNTER — Other Ambulatory Visit: Payer: Self-pay

## 2017-08-28 ENCOUNTER — Ambulatory Visit (AMBULATORY_SURGERY_CENTER): Payer: Self-pay | Admitting: *Deleted

## 2017-08-28 VITALS — Ht 71.0 in | Wt 202.0 lb

## 2017-08-28 DIAGNOSIS — Z800 Family history of malignant neoplasm of digestive organs: Secondary | ICD-10-CM

## 2017-08-28 DIAGNOSIS — Z86010 Personal history of colonic polyps: Secondary | ICD-10-CM

## 2017-08-28 MED ORDER — NA SULFATE-K SULFATE-MG SULF 17.5-3.13-1.6 GM/177ML PO SOLN: ORAL | 0 refills | Status: DC

## 2017-08-28 NOTE — Progress Notes (Signed)
Patient denies any allergies to eggs or soy. Patient denies any problems with anesthesia/sedation. Patient denies any oxygen use at home. Patient denies taking any diet/weight loss medications or blood thinners. EMMI education assisgned to patient on colonoscopy, this was explained and instructions given to patient. 

## 2017-09-04 ENCOUNTER — Encounter: Payer: Self-pay | Admitting: Gastroenterology

## 2017-09-18 ENCOUNTER — Other Ambulatory Visit: Payer: Self-pay

## 2017-09-18 ENCOUNTER — Encounter: Payer: Self-pay | Admitting: Gastroenterology

## 2017-09-18 ENCOUNTER — Ambulatory Visit (AMBULATORY_SURGERY_CENTER): Payer: 59 | Admitting: Gastroenterology

## 2017-09-18 VITALS — BP 117/87 | HR 57 | Temp 98.7°F | Resp 13 | Ht 71.0 in | Wt 202.0 lb

## 2017-09-18 DIAGNOSIS — D123 Benign neoplasm of transverse colon: Secondary | ICD-10-CM | POA: Diagnosis not present

## 2017-09-18 DIAGNOSIS — D120 Benign neoplasm of cecum: Secondary | ICD-10-CM

## 2017-09-18 DIAGNOSIS — Z86010 Personal history of colonic polyps: Secondary | ICD-10-CM

## 2017-09-18 MED ORDER — SODIUM CHLORIDE 0.9 % IV SOLN: 500.0000 mL | Freq: Once | INTRAVENOUS | Status: DC

## 2017-09-18 NOTE — Progress Notes (Signed)
Report to PACU, RN, vss, BBS= Clear.  

## 2017-09-18 NOTE — Progress Notes (Signed)
Pt's states no medical or surgical changes since previsit or office visit. 

## 2017-09-18 NOTE — Patient Instructions (Signed)
HANDOUTS GIVEN FOR POLYPS, DIVERTICULOSIS AND HIGH FIBER DIET  YOU HAD AN ENDOSCOPIC PROCEDURE TODAY AT THE  ENDOSCOPY CENTER:   Refer to the procedure report that was given to you for any specific questions about what was found during the examination.  If the procedure report does not answer your questions, please call your gastroenterologist to clarify.  If you requested that your care partner not be given the details of your procedure findings, then the procedure report has been included in a sealed envelope for you to review at your convenience later.  YOU SHOULD EXPECT: Some feelings of bloating in the abdomen. Passage of more gas than usual.  Walking can help get rid of the air that was put into your GI tract during the procedure and reduce the bloating. If you had a lower endoscopy (such as a colonoscopy or flexible sigmoidoscopy) you may notice spotting of blood in your stool or on the toilet paper. If you underwent a bowel prep for your procedure, you may not have a normal bowel movement for a few days.  Please Note:  You might notice some irritation and congestion in your nose or some drainage.  This is from the oxygen used during your procedure.  There is no need for concern and it should clear up in a day or so.  SYMPTOMS TO REPORT IMMEDIATELY:   Following lower endoscopy (colonoscopy or flexible sigmoidoscopy):  Excessive amounts of blood in the stool  Significant tenderness or worsening of abdominal pains  Swelling of the abdomen that is new, acute  Fever of 100F or higher  For urgent or emergent issues, a gastroenterologist can be reached at any hour by calling [phone].   DIET:  We do recommend a small meal at first, but then you may proceed to your regular diet.  Drink plenty of fluids but you should avoid alcoholic beverages for 24 hours.  ACTIVITY:  You should plan to take it easy for the rest of today and you should NOT DRIVE or use heavy machinery until  tomorrow (because of the sedation medicines used during the test).    FOLLOW UP: Our staff will call the number listed on your records the next business day following your procedure to check on you and address any questions or concerns that you may have regarding the information given to you following your procedure. If we do not reach you, we will leave a message.  However, if you are feeling well and you are not experiencing any problems, there is no need to return our call.  We will assume that you have returned to your regular daily activities without incident.  If any biopsies were taken you will be contacted by phone or by letter within the next 1-3 weeks.  Please call us at [phone] if you have not heard about the biopsies in 3 weeks.    SIGNATURES/CONFIDENTIALITY: You and/or your care partner have signed paperwork which will be entered into your electronic medical record.  These signatures attest to the fact that that the information above on your After Visit Summary has been reviewed and is understood.  Full responsibility of the confidentiality of this discharge information lies with you and/or your care-partner. 

## 2017-09-18 NOTE — Op Note (Signed)
[name]: Scott Dalton Procedure Date: 09/18/2017 1:27 PM MRN: [id number] Endoscopist: Ladene Artist , MD Age: 57 Referring MD:  Date of Birth: 08/03/1960 Gender: Male Account #: [id number] Procedure:                Colonoscopy Indications:              Surveillance: Personal history of adenomatous                            polyps on last colonoscopy 5 years ago Medicines:                Monitored Anesthesia Care Procedure:                Pre-Anesthesia Assessment:                           - Prior to the procedure, a History and Physical                            was performed, and patient medications and                            allergies were reviewed. The patient's tolerance of                            previous anesthesia was also reviewed. The risks                            and benefits of the procedure and the sedation                            options and risks were discussed with the patient.                            All questions were answered, and informed consent                            was obtained. Prior Anticoagulants: The patient has                            taken no previous anticoagulant or antiplatelet                            agents. ASA Grade Assessment: II - A patient with                            mild systemic disease. After reviewing the risks                            and benefits, the patient was deemed in                            satisfactory condition to undergo the procedure.  After obtaining informed consent, the colonoscope                            was passed under direct vision. Throughout the                            procedure, the patient's blood pressure, pulse, and                            oxygen saturations were monitored continuously. The                            Model PCF-[id number]) scope was introduced                            through the anus and  advanced to the the cecum,                            identified by appendiceal orifice and ileocecal                            valve. The ileocecal valve, appendiceal orifice,                            and rectum were photographed. The quality of the                            bowel preparation was good. The colonoscopy was                            performed without difficulty. The patient tolerated                            the procedure well. Scope In: 1:39:20 PM Scope Out: 1:56:53 PM Scope Withdrawal Time: 0 hours 15 minutes 11 seconds  Total Procedure Duration: 0 hours 17 minutes 33 seconds  Findings:                 The perianal and digital rectal examinations were                            normal.                           A 3 mm polyp was found in the cecum. The polyp was                            sessile. The polyp was removed with a cold biopsy                            forceps. Resection and retrieval were complete.                           Three sessile polyps were found in the transverse  colon. The polyps were 6 to 7 mm in size. These                            polyps were removed with a cold snare. Resection                            and retrieval were complete.                           Multiple diverticula were found in the left colon.                            There was no evidence of diverticular bleeding.                           The exam was otherwise without abnormality on                            direct and retroflexion views. Complications:            No immediate complications. Estimated blood loss:                            None. Estimated Blood Loss:     Estimated blood loss: none. Impression:               - One 3 mm polyp in the cecum, removed with a cold                            biopsy forceps. Resected and retrieved.                           - Three 6 to 7 mm polyps in the transverse colon,                             removed with a cold snare. Resected and retrieved.                           - Moderate diverticulosis in the left colon. There                            was no evidence of diverticular bleeding. Recommendation:           - Repeat colonoscopy in 3 - 5 years for                            surveillance pending pathology review.                           - Patient has a contact number available for                            emergencies. The signs and symptoms of potential  delayed complications were discussed with the                            patient. Return to normal activities tomorrow.                            Written discharge instructions were provided to the                            patient.                           - High fiber diet.                           - Continue present medications.                           - Await pathology results. Ladene Artist, MD 09/18/2017 2:03:21 PM This report has been signed electronically.

## 2017-09-18 NOTE — Progress Notes (Signed)
Called to room to assist during endoscopic procedure.  Patient ID and intended procedure confirmed with present staff. Received instructions for my participation in the procedure from the performing physician.  

## 2017-09-19 ENCOUNTER — Telehealth: Payer: Self-pay | Admitting: *Deleted

## 2017-09-19 NOTE — Telephone Encounter (Signed)
  Follow up Call-  Call back number 09/18/2017  Post procedure Call Back phone  # [phone]  Permission to leave phone message Yes  Some recent data might be hidden     Patient questions:  Do you have a fever, pain , or abdominal swelling? No. Pain Score  0 *  Have you tolerated food without any problems? Yes.    Have you been able to return to your normal activities? Yes.    Do you have any questions about your discharge instructions: Diet   No. Medications  No. Follow up visit  No.  Do you have questions or concerns about your Care? No.  Actions: * If pain score is 4 or above: No action needed, pain <4.

## 2017-09-26 ENCOUNTER — Encounter: Payer: Self-pay | Admitting: Gastroenterology

## 2019-03-28 ENCOUNTER — Other Ambulatory Visit: Payer: Self-pay

## 2019-03-28 DIAGNOSIS — Z20822 Contact with and (suspected) exposure to covid-19: Secondary | ICD-10-CM

## 2019-03-29 LAB — NOVEL CORONAVIRUS, NAA: SARS-CoV-2, NAA: NOT DETECTED

## 2019-05-15 ENCOUNTER — Ambulatory Visit: Payer: Managed Care, Other (non HMO) | Attending: Internal Medicine

## 2019-05-15 DIAGNOSIS — Z20822 Contact with and (suspected) exposure to covid-19: Secondary | ICD-10-CM

## 2019-05-17 LAB — NOVEL CORONAVIRUS, NAA: SARS-CoV-2, NAA: NOT DETECTED

## 2019-06-26 ENCOUNTER — Ambulatory Visit: Payer: Self-pay | Attending: Internal Medicine

## 2019-06-26 DIAGNOSIS — Z20822 Contact with and (suspected) exposure to covid-19: Secondary | ICD-10-CM

## 2019-06-27 LAB — NOVEL CORONAVIRUS, NAA: SARS-CoV-2, NAA: NOT DETECTED

## 2019-08-03 ENCOUNTER — Ambulatory Visit: Payer: Self-pay | Attending: Internal Medicine

## 2019-08-03 DIAGNOSIS — Z23 Encounter for immunization: Secondary | ICD-10-CM

## 2019-08-03 NOTE — Progress Notes (Signed)
   Covid-19 Vaccination Clinic  Name:  Scott Dalton    MRN: [id number] DOB: Oct 08, 1960  08/03/2019  Scott Dalton was observed post Covid-19 immunization for 15 minutes without incident. He was provided with Vaccine Information Sheet and instruction to access the V-Safe system.   Scott Dalton was instructed to call 911 with any severe reactions post vaccine: Marland Kitchen Difficulty breathing  . Swelling of face and throat  . A fast heartbeat  . A bad rash all over body  . Dizziness and weakness   Immunizations Administered    Name Date Dose VIS Date Route   Pfizer COVID-19 Vaccine 08/03/2019 11:04 AM 0.3 mL 05/03/2019 Intramuscular   Manufacturer: [place]   Lot: [id number]   [place]: [id number]

## 2019-08-28 ENCOUNTER — Ambulatory Visit: Payer: Self-pay | Attending: Internal Medicine

## 2019-08-28 DIAGNOSIS — Z23 Encounter for immunization: Secondary | ICD-10-CM

## 2019-08-28 NOTE — Progress Notes (Signed)
   Covid-19 Vaccination Clinic  Name:  STEVENS PUENTES    MRN: [id number] DOB: 31-Oct-1960  08/28/2019  Mr. Subasic was observed post Covid-19 immunization for 15 minutes without incident. He was provided with Vaccine Information Sheet and instruction to access the V-Safe system.   Mr. Rossner was instructed to call 911 with any severe reactions post vaccine: Marland Kitchen Difficulty breathing  . Swelling of face and throat  . A fast heartbeat  . A bad rash all over body  . Dizziness and weakness   Immunizations Administered    Name Date Dose VIS Date Route   Pfizer COVID-19 Vaccine 08/28/2019  3:52 PM 0.3 mL 05/03/2019 Intramuscular   Manufacturer: [place]   Lot: [id number]   [place]: [id number]

## 2020-10-29 ENCOUNTER — Ambulatory Visit (AMBULATORY_SURGERY_CENTER): Payer: 59

## 2020-10-29 VITALS — Ht 71.0 in | Wt 205.0 lb

## 2020-10-29 DIAGNOSIS — Z86010 Personal history of colonic polyps: Secondary | ICD-10-CM

## 2020-10-29 MED ORDER — PEG 3350-KCL-NA BICARB-NACL 420 G PO SOLR: 4000.0000 mL | Freq: Once | ORAL | 0 refills | Status: AC

## 2020-10-29 NOTE — Progress Notes (Signed)

## 2020-11-12 ENCOUNTER — Ambulatory Visit (AMBULATORY_SURGERY_CENTER): Payer: 59 | Admitting: Gastroenterology

## 2020-11-12 ENCOUNTER — Encounter: Payer: Self-pay | Admitting: Gastroenterology

## 2020-11-12 ENCOUNTER — Other Ambulatory Visit: Payer: Self-pay

## 2020-11-12 VITALS — BP 130/84 | HR 61 | Temp 98.0°F | Resp 14 | Ht 71.0 in | Wt 205.0 lb

## 2020-11-12 DIAGNOSIS — K5140 Inflammatory polyps of colon without complications: Secondary | ICD-10-CM

## 2020-11-12 DIAGNOSIS — D122 Benign neoplasm of ascending colon: Secondary | ICD-10-CM

## 2020-11-12 DIAGNOSIS — Z86010 Personal history of colonic polyps: Secondary | ICD-10-CM

## 2020-11-12 MED ORDER — SODIUM CHLORIDE 0.9 % IV SOLN: 500.0000 mL | Freq: Once | INTRAVENOUS | Status: DC

## 2020-11-12 NOTE — Progress Notes (Signed)
Medical history reviewed with no changes noted. VS assessed by C.W 

## 2020-11-12 NOTE — Patient Instructions (Signed)
Handout given:  polyps, high Fiber diet Start high fiber diet Continue current medications Await pathology results  YOU HAD AN ENDOSCOPIC PROCEDURE TODAY AT [facility]:   Refer to the procedure report that was given to you for any specific questions about what was found during the examination.  If the procedure report does not answer your questions, please call your gastroenterologist to clarify.  If you requested that your care partner not be given the details of your procedure findings, then the procedure report has been included in a sealed envelope for you to review at your convenience later.  YOU SHOULD EXPECT: Some feelings of bloating in the abdomen. Passage of more gas than usual.  Walking can help get rid of the air that was put into your GI tract during the procedure and reduce the bloating. If you had a lower endoscopy (such as a colonoscopy or flexible sigmoidoscopy) you may notice spotting of blood in your stool or on the toilet paper. If you underwent a bowel prep for your procedure, you may not have a normal bowel movement for a few days.  Please Note:  You might notice some irritation and congestion in your nose or some drainage.  This is from the oxygen used during your procedure.  There is no need for concern and it should clear up in a day or so.  SYMPTOMS TO REPORT IMMEDIATELY:  Following lower endoscopy (colonoscopy or flexible sigmoidoscopy):  Excessive amounts of blood in the stool  Significant tenderness or worsening of abdominal pains  Swelling of the abdomen that is new, acute  Fever of 100F or higher  for urgent or emergent issues, a gastroenterologist can be reached at any hour by calling [phone]. Do not use MyChart messaging for urgent concerns.   DIET:  We do recommend a small meal at first, but then you may proceed to your regular diet.  Drink plenty of fluids but you should avoid alcoholic beverages for 24 hours.  ACTIVITY:  You should  plan to take it easy for the rest of today and you should NOT DRIVE or use heavy machinery until tomorrow (because of the sedation medicines used during the test).    FOLLOW UP: Our staff will call the number listed on your records 48-72 hours following your procedure to check on you and address any questions or concerns that you may have regarding the information given to you following your procedure. If we do not reach you, we will leave a message.  We will attempt to reach you two times.  During this call, we will ask if you have developed any symptoms of COVID 19. If you develop any symptoms (ie: fever, flu-like symptoms, shortness of breath, cough etc.) before then, please call [phone].  If you test positive for Covid 19 in the 2 weeks post procedure, please call and report this information to [place].    If any biopsies were taken you will be contacted by phone or by letter within the next 1-3 weeks.  Please call us at [phone] if you have not heard about the biopsies in 3 weeks.   SIGNATURES/CONFIDENTIALITY: You and/or your care partner have signed paperwork which will be entered into your electronic medical record.  These signatures attest to the fact that that the information above on your After Visit Summary has been reviewed and is understood.  Full responsibility of the confidentiality of this discharge information lies with you and/or your care-partner.

## 2020-11-12 NOTE — Op Note (Signed)
[facility] Patient Name: Scott Dalton Procedure Date: 11/12/2020 11:01 AM MRN: [id number] Endoscopist: Ladene Artist , MD Age: 60 Referring MD:  Date of Birth: 02-01-1961 Gender: Male Account #: [id number] Procedure:                Colonoscopy Indications:              Surveillance: Personal history of adenomatous                            polyps on last colonoscopy 3 years ago Medicines:                Monitored Anesthesia Care Procedure:                Pre-Anesthesia Assessment:                           - Prior to the procedure, a History and Physical                            was performed, and patient medications and                            allergies were reviewed. The patient's tolerance of                            previous anesthesia was also reviewed. The risks                            and benefits of the procedure and the sedation                            options and risks were discussed with the patient.                            All questions were answered, and informed consent                            was obtained. Prior Anticoagulants: The patient has                            taken no previous anticoagulant or antiplatelet                            agents. ASA Grade Assessment: II - A patient with                            mild systemic disease. After reviewing the risks                            and benefits, the patient was deemed in                            satisfactory condition to undergo the procedure.  After obtaining informed consent, the colonoscope                            was passed under direct vision. Throughout the                            procedure, the patient's blood pressure, pulse, and                            oxygen saturations were monitored continuously. The                            Olympus CF-HQ190 [id number]) Colonoscope was                            introduced through the anus  and advanced to the the                            cecum, identified by appendiceal orifice and                            ileocecal valve. The ileocecal valve, appendiceal                            orifice, and rectum were photographed. The quality                            of the bowel preparation was good. The colonoscopy                            was performed without difficulty. The patient                            tolerated the procedure well. Scope In: 11:07:46 AM Scope Out: 11:21:46 AM Scope Withdrawal Time: 0 hours 12 minutes 18 seconds  Total Procedure Duration: 0 hours 14 minutes 0 seconds  Findings:                 The perianal and digital rectal examinations were                            normal.                           A 5 mm polyp was found in the ascending colon. The                            polyp was sessile. The polyp was removed with a                            cold snare. Resection and retrieval were complete.                           Multiple medium-mouthed diverticula were found in  the left colon. There was no evidence of                            diverticular bleeding.                           Internal hemorrhoids were found during                            retroflexion. The hemorrhoids were small and Grade                            I (internal hemorrhoids that do not prolapse).                           The exam was otherwise without abnormality on                            direct and retroflexion views. Complications:            No immediate complications. Estimated blood loss:                            None. Estimated Blood Loss:     Estimated blood loss: none. Impression:               - One 5 mm polyp in the ascending colon, removed                            with a cold snare. Resected and retrieved.                           - Moderate diverticulosis in the left colon.                           - Internal  hemorrhoids.                           - The examination was otherwise normal on direct                            and retroflexion views. Recommendation:           - Repeat colonoscopy after studies are complete for                            surveillance based on pathology results.                           - Patient has a contact number available for                            emergencies. The signs and symptoms of potential                            delayed complications were discussed with the  patient. Return to normal activities tomorrow.                            Written discharge instructions were provided to the                            patient.                           - High fiber diet.                           - Continue present medications.                           - Await pathology results. Ladene Artist, MD 11/12/2020 11:25:25 AM This report has been signed electronically.

## 2020-11-12 NOTE — Progress Notes (Signed)
To PACU, vss. Report to [email or web address] 

## 2020-11-12 NOTE — Progress Notes (Signed)
Called to room to assist during endoscopic procedure.  Patient ID and intended procedure confirmed with present staff. Received instructions for my participation in the procedure from the performing physician.  

## 2020-11-16 ENCOUNTER — Telehealth: Payer: Self-pay | Admitting: *Deleted

## 2020-11-16 NOTE — Telephone Encounter (Signed)
  Follow up Call-  Call back number 11/12/2020  Post procedure Call Back phone  # [phone]  Permission to leave phone message Yes  Some recent data might be hidden     Patient questions:  Do you have a fever, pain , or abdominal swelling? No. Pain Score  0 *  Have you tolerated food without any problems? Yes.    Have you been able to return to your normal activities? Yes.    Do you have any questions about your discharge instructions: Diet   No. Medications  No. Follow up visit  No.  Do you have questions or concerns about your Care? No.  Actions: * If pain score is 4 or above: No action needed, pain <4.  Have you developed a fever since your procedure? no  2.   Have you had an respiratory symptoms (SOB or cough) since your procedure? no  3.   Have you tested positive for COVID 19 since your procedure no  4.   Have you had any family members/close contacts diagnosed with the COVID 19 since your procedure?  no   If yes to any of these questions please route to Joylene John, RN and Joella Prince, RN

## 2020-11-23 ENCOUNTER — Encounter: Payer: Self-pay | Admitting: Gastroenterology

## 2021-06-14 DIAGNOSIS — C44219 Basal cell carcinoma of skin of left ear and external auricular canal: Secondary | ICD-10-CM | POA: Diagnosis not present

## 2021-06-16 DIAGNOSIS — Z125 Encounter for screening for malignant neoplasm of prostate: Secondary | ICD-10-CM | POA: Diagnosis not present

## 2021-06-16 DIAGNOSIS — E7800 Pure hypercholesterolemia, unspecified: Secondary | ICD-10-CM | POA: Diagnosis not present

## 2021-07-05 DIAGNOSIS — E7800 Pure hypercholesterolemia, unspecified: Secondary | ICD-10-CM | POA: Diagnosis not present

## 2021-07-05 DIAGNOSIS — N3281 Overactive bladder: Secondary | ICD-10-CM | POA: Diagnosis not present

## 2021-07-05 DIAGNOSIS — G4733 Obstructive sleep apnea (adult) (pediatric): Secondary | ICD-10-CM | POA: Diagnosis not present

## 2021-10-15 DIAGNOSIS — J40 Bronchitis, not specified as acute or chronic: Secondary | ICD-10-CM | POA: Diagnosis not present

## 2021-10-15 DIAGNOSIS — R059 Cough, unspecified: Secondary | ICD-10-CM | POA: Diagnosis not present

## 2021-10-15 DIAGNOSIS — J019 Acute sinusitis, unspecified: Secondary | ICD-10-CM | POA: Diagnosis not present

## 2021-10-15 DIAGNOSIS — R0981 Nasal congestion: Secondary | ICD-10-CM | POA: Diagnosis not present

## 2021-11-01 DIAGNOSIS — H95191 Other disorders following mastoidectomy, right ear: Secondary | ICD-10-CM | POA: Diagnosis not present

## 2022-02-21 DIAGNOSIS — M5416 Radiculopathy, lumbar region: Secondary | ICD-10-CM | POA: Diagnosis not present

## 2022-02-21 DIAGNOSIS — G5701 Lesion of sciatic nerve, right lower limb: Secondary | ICD-10-CM | POA: Diagnosis not present

## 2022-05-31 DIAGNOSIS — H95191 Other disorders following mastoidectomy, right ear: Secondary | ICD-10-CM | POA: Diagnosis not present

## 2022-06-01 DIAGNOSIS — D485 Neoplasm of uncertain behavior of skin: Secondary | ICD-10-CM | POA: Diagnosis not present

## 2022-06-01 DIAGNOSIS — D225 Melanocytic nevi of trunk: Secondary | ICD-10-CM | POA: Diagnosis not present

## 2022-06-01 DIAGNOSIS — Z85828 Personal history of other malignant neoplasm of skin: Secondary | ICD-10-CM | POA: Diagnosis not present

## 2022-06-01 DIAGNOSIS — C44612 Basal cell carcinoma of skin of right upper limb, including shoulder: Secondary | ICD-10-CM | POA: Diagnosis not present

## 2022-06-01 DIAGNOSIS — L503 Dermatographic urticaria: Secondary | ICD-10-CM | POA: Diagnosis not present

## 2022-06-01 DIAGNOSIS — L821 Other seborrheic keratosis: Secondary | ICD-10-CM | POA: Diagnosis not present

## 2022-06-01 DIAGNOSIS — B351 Tinea unguium: Secondary | ICD-10-CM | POA: Diagnosis not present

## 2022-06-01 DIAGNOSIS — L570 Actinic keratosis: Secondary | ICD-10-CM | POA: Diagnosis not present

## 2022-06-01 DIAGNOSIS — L814 Other melanin hyperpigmentation: Secondary | ICD-10-CM | POA: Diagnosis not present

## 2022-06-01 DIAGNOSIS — L820 Inflamed seborrheic keratosis: Secondary | ICD-10-CM | POA: Diagnosis not present

## 2022-06-13 DIAGNOSIS — C44612 Basal cell carcinoma of skin of right upper limb, including shoulder: Secondary | ICD-10-CM | POA: Diagnosis not present

## 2022-06-30 DIAGNOSIS — E7800 Pure hypercholesterolemia, unspecified: Secondary | ICD-10-CM | POA: Diagnosis not present

## 2022-06-30 DIAGNOSIS — Z125 Encounter for screening for malignant neoplasm of prostate: Secondary | ICD-10-CM | POA: Diagnosis not present

## 2022-07-29 DIAGNOSIS — H906 Mixed conductive and sensorineural hearing loss, bilateral: Secondary | ICD-10-CM | POA: Diagnosis not present

## 2022-07-29 DIAGNOSIS — H9211 Otorrhea, right ear: Secondary | ICD-10-CM | POA: Diagnosis not present

## 2022-07-29 DIAGNOSIS — Z9089 Acquired absence of other organs: Secondary | ICD-10-CM | POA: Diagnosis not present

## 2022-09-28 DIAGNOSIS — Z85828 Personal history of other malignant neoplasm of skin: Secondary | ICD-10-CM | POA: Diagnosis not present

## 2022-09-28 DIAGNOSIS — L820 Inflamed seborrheic keratosis: Secondary | ICD-10-CM | POA: Diagnosis not present

## 2022-09-28 DIAGNOSIS — D0422 Carcinoma in situ of skin of left ear and external auricular canal: Secondary | ICD-10-CM | POA: Diagnosis not present

## 2022-09-28 DIAGNOSIS — D485 Neoplasm of uncertain behavior of skin: Secondary | ICD-10-CM | POA: Diagnosis not present

## 2022-09-28 DIAGNOSIS — L821 Other seborrheic keratosis: Secondary | ICD-10-CM | POA: Diagnosis not present

## 2022-09-29 ENCOUNTER — Ambulatory Visit: Payer: 59 | Admitting: Podiatry

## 2022-09-29 ENCOUNTER — Ambulatory Visit: Payer: 59

## 2022-09-29 ENCOUNTER — Ambulatory Visit (INDEPENDENT_AMBULATORY_CARE_PROVIDER_SITE_OTHER): Payer: 59

## 2022-09-29 DIAGNOSIS — B351 Tinea unguium: Secondary | ICD-10-CM | POA: Diagnosis not present

## 2022-09-29 DIAGNOSIS — M7989 Other specified soft tissue disorders: Secondary | ICD-10-CM

## 2022-09-29 DIAGNOSIS — D1723 Benign lipomatous neoplasm of skin and subcutaneous tissue of right leg: Secondary | ICD-10-CM

## 2022-09-29 MED ORDER — EFINACONAZOLE 10 % EX SOLN: 1.0000 [drp] | Freq: Every day | CUTANEOUS | 11 refills | Status: AC

## 2022-09-29 NOTE — Progress Notes (Signed)
Subjective:   Patient ID: Scott Dalton, male   DOB: 62 y.o.   MRN: [id number]   HPI Chief Complaint  Patient presents with   Foot Problem    Right foot lipoma in the pad on foot - bothersome anytime pressure is applied to that foot. Onset 5 years and has been growing in size.     62 year old male presents with above concerns.  Previous met fracture right 5th met 2015. After cast came off he had some "fatty tissue" to the ball of the foot. He let it go and he wears good shoes but when he walks on hard floors he can feel it.   He also has some secondary concerns for nail fungus.  No pain in the nails however they are becoming more thick and discolored.   Review of Systems  All other systems reviewed and are negative.  Past Medical History:  Diagnosis Date   Displaced fracture of fifth metatarsal bone 04/22/2011   Hyperlipidemia     Past Surgical History:  Procedure Laterality Date   COLONOSCOPY  2014   COLONOSCOPY  08/2017   ORIF TOE FRACTURE  04/22/2011   Procedure: OPEN REDUCTION INTERNAL FIXATION (ORIF) METATARSAL (TOE) FRACTURE;  Surgeon: Eulas Post;  Location: [facility];  Service: Orthopedics;  Laterality: Right;  RIGHT V TOE FRACTURE OPEN TREATMENT METATARSAL INCLUDES INTERNAL FIXATION   TYMPANOPLASTY W/ MASTOIDECTOMY     bilateral     Current Outpatient Medications:    Efinaconazole 10 % SOLN, Apply 1 drop topically daily., Disp: 4 mL, Rfl: 11   aspirin 81 MG chewable tablet, Chew 81 mg by mouth daily.   (Patient not taking: Reported on 11/12/2020), Disp: , Rfl:    cholecalciferol (VITAMIN D) 1000 UNITS tablet, Take 1,000 Units by mouth daily. Takes total of 4000 units daily (Patient not taking: Reported on 11/12/2020), Disp: , Rfl:    ibuprofen (ADVIL,MOTRIN) 200 MG tablet, Take 200 mg by mouth every 6 (six) hours as needed. (Patient not taking: Reported on 11/12/2020), Disp: , Rfl:    simvastatin (ZOCOR) 20 MG tablet, Take 20 mg by mouth at  bedtime.  , Disp: , Rfl:   No Known Allergies         Objective:  Physical Exam  General: AAO x3, NAD  Dermatological: Toenails are mildly hypertrophic, dystrophic with yellow discoloration.  No hyperpigmentation.  No edema, erythema.  Definitions.  Vascular: Dorsalis Pedis artery and Posterior Tibial artery pedal pulses are 2/4 bilateral with immedate capillary fill time. There is no pain with calf compression, swelling, warmth, erythema.   Neruologic: Grossly intact via light touch bilateral.   Musculoskeletal: On the right foot more on the first interspace plantarly is what appears to be a mobile soft tissue mass consistent with appears to be more of a ganglion on cyst.  There is no fluctuance or crepitation.  No erythema or warmth.  No area pinpoint tenderness.  Muscular strength 5/5 in all groups tested bilateral.  Gait: Unassisted, Nonantalgic.       Assessment:   62 year old male with soft tissue mass right foot; onychomycosis     Plan:  -Treatment options discussed including all alternatives, risks, and complications -Etiology of symptoms were discussed -X-rays were obtained and reviewed with the patient.  3 views of the foot were obtained.  No evidence of acute fracture, calcifications.  Previous fifth metatarsal fracture noted which is healed. -For the soft tissue mass we discussed treatment options including conservative versus surgical  options.they are going to start with a steroid injection we discussed today he wants to proceed.  I cleaned the skin with alcohol and mixture of 1 cc Kenalog 10, 0.5 cc Marcaine plain, 0.5 cc of lidocaine plain was infiltrated into the soft tissue mass without complications.  Postinjection care discussed.  Tolerated well. -Discussed different options for nail fungus.  Was to proceed with topical.  Prescribed Jublia.  Vivi Barrack DPM

## 2022-09-29 NOTE — Patient Instructions (Addendum)
While at your visit today you received a steroid injection in your foot or ankle to help with your pain. Along with having the steroid medication there is some "numbing" medication in the shot that you received. Due to this you may notice some numbness to the area for the next couple of hours.   I would recommend limiting activity for the next few days to help the steroid injection take affect.    The actually benefit from the steroid injection may take up to 2-7 days to see a difference. You may actually experience a small (as in 10%) INCREASE in pain in the first 24 hours---that is common. It would be best if you can ice the area today and take anti-inflammatory medications (such as Ibuprofen, Motrin, or Aleve) if you are able to take these medications. If you were prescribed another medication to help with the pain go ahead and start that medication today    Things to watch out for that you should contact us or a health care provider urgently would include: 1. Unusual (as in more than 10%) increase in pain 2. New fever > 101.5 3. New swelling or redness of the injected area.  4. Streaking of red lines around the area injected.  If you have any questions or concerns about this, please give our office a call at [phone].    ---     Efinaconazole Topical Solution What is this medication? EFINACONAZOLE (e FEE na KON a zole) treats fungal infections of the nails. It belongs to a group of medications called antifungals. It will not treat infections caused by bacteria or viruses. This medicine may be used for other purposes; ask your health care provider or pharmacist if you have questions. COMMON BRAND NAME(S): JUBLIA What should I tell my care team before I take this medication? They need to know if you have any of these conditions: An unusual or allergic reaction to efinaconazole, other medications, foods, dyes or preservatives Pregnant or trying to get pregnant Breast-feeding How  should I use this medication? This medication is for external use only. Do not take by mouth. Wash your hands before and after use. Do not get it in your eyes. If you do, rinse your eyes with plenty of cool tap water. Use it as directed on the prescription label. Do not use it more often than directed. Use the medication for the full course as directed by your care team, even if you think you are better. Do not stop using it unless your care team tells you to stop it early. This medication comes with INSTRUCTIONS FOR USE. Ask your pharmacist for directions on how to use this medication. Read the information carefully. Talk to your pharmacist or care team if you have questions. Talk to your care team about the use of this medication in children. While it may be prescribed for children as young as 6 years for selected conditions, precautions do apply. Overdosage: If you think you have taken too much of this medicine contact a poison control center or emergency room at once. NOTE: This medicine is only for you. Do not share this medicine with others. What if I miss a dose? If you miss a dose, use it as soon as you can. If it is almost time for your next dose, use only that dose. Do not use double or extra doses. What may interact with this medication? Interactions are not expected. Do not use any other skin products on the same area  of skin without talking to your care team. This list may not describe all possible interactions. Give your health care provider a list of all the medicines, herbs, non-prescription drugs, or dietary supplements you use. Also tell them if you smoke, drink alcohol, or use illegal drugs. Some items may interact with your medicine. What should I watch for while using this medication? Visit your care team for regular checks on your progress. It may be some time before you see the benefit from this medication. Do not use nail polish or other nail cosmetic products on the treated  nails. What side effects may I notice from receiving this medication? Side effects that you should report to your care team as soon as possible: Allergic reactions--skin rash, itching, hives, swelling of the face, lips, tongue, or throat Side effects that usually do not require medical attention (report to your care team if they continue or are bothersome): Ingrown nails Mild skin irritation, redness, or dryness This list may not describe all possible side effects. Call your doctor for medical advice about side effects. You may report side effects to FDA at 1-800-FDA-1088. Where should I keep my medication? Keep out of the reach of children and pets. Store at room temperature between 20 and 25 degrees C (68 and 77 degrees F). Do not freeze. Keep the container tightly closed. Get rid of any unused medication after the expiration date. This medication is flammable. Avoid exposure to heat, flame, and smoking. To get rid of medications that are no longer needed or have expired: Take the medication to a medication take-back program. Check with your pharmacy or law enforcement to find a location. If you cannot return the medication, ask your pharmacist or care team how to get rid of this medication safely. NOTE: This sheet is a summary. It may not cover all possible information. If you have questions about this medicine, talk to your doctor, pharmacist, or health care provider.  2023 Elsevier/Gold Standard (2019-05-15 00:00:00)

## 2022-11-01 DIAGNOSIS — C44229 Squamous cell carcinoma of skin of left ear and external auricular canal: Secondary | ICD-10-CM | POA: Diagnosis not present

## 2022-12-01 DIAGNOSIS — H906 Mixed conductive and sensorineural hearing loss, bilateral: Secondary | ICD-10-CM | POA: Diagnosis not present

## 2022-12-01 DIAGNOSIS — H95191 Other disorders following mastoidectomy, right ear: Secondary | ICD-10-CM | POA: Diagnosis not present

## 2022-12-01 DIAGNOSIS — Z9089 Acquired absence of other organs: Secondary | ICD-10-CM | POA: Diagnosis not present

## 2023-04-11 DIAGNOSIS — L989 Disorder of the skin and subcutaneous tissue, unspecified: Secondary | ICD-10-CM | POA: Diagnosis not present

## 2023-05-03 DIAGNOSIS — H906 Mixed conductive and sensorineural hearing loss, bilateral: Secondary | ICD-10-CM | POA: Diagnosis not present

## 2023-05-03 DIAGNOSIS — H6121 Impacted cerumen, right ear: Secondary | ICD-10-CM | POA: Diagnosis not present

## 2023-05-03 DIAGNOSIS — H9221 Otorrhagia, right ear: Secondary | ICD-10-CM | POA: Diagnosis not present

## 2023-05-03 DIAGNOSIS — Z9889 Other specified postprocedural states: Secondary | ICD-10-CM | POA: Diagnosis not present

## 2023-06-14 DIAGNOSIS — Z113 Encounter for screening for infections with a predominantly sexual mode of transmission: Secondary | ICD-10-CM | POA: Diagnosis not present

## 2023-06-27 DIAGNOSIS — H906 Mixed conductive and sensorineural hearing loss, bilateral: Secondary | ICD-10-CM | POA: Diagnosis not present

## 2023-06-30 DIAGNOSIS — J209 Acute bronchitis, unspecified: Secondary | ICD-10-CM | POA: Diagnosis not present

## 2023-07-17 DIAGNOSIS — Z125 Encounter for screening for malignant neoplasm of prostate: Secondary | ICD-10-CM | POA: Diagnosis not present

## 2023-07-17 DIAGNOSIS — E7800 Pure hypercholesterolemia, unspecified: Secondary | ICD-10-CM | POA: Diagnosis not present

## 2023-07-17 DIAGNOSIS — K588 Other irritable bowel syndrome: Secondary | ICD-10-CM | POA: Diagnosis not present

## 2023-07-17 DIAGNOSIS — Z0000 Encounter for general adult medical examination without abnormal findings: Secondary | ICD-10-CM | POA: Diagnosis not present

## 2023-07-17 DIAGNOSIS — L503 Dermatographic urticaria: Secondary | ICD-10-CM | POA: Diagnosis not present

## 2023-07-25 ENCOUNTER — Emergency Department (HOSPITAL_BASED_OUTPATIENT_CLINIC_OR_DEPARTMENT_OTHER)

## 2023-07-25 ENCOUNTER — Emergency Department (HOSPITAL_BASED_OUTPATIENT_CLINIC_OR_DEPARTMENT_OTHER)
Admission: EM | Admit: 2023-07-25 | Discharge: 2023-07-26 | Disposition: A | Discharge status: Home / Self Care | Attending: Emergency Medicine | Admitting: Emergency Medicine

## 2023-07-25 ENCOUNTER — Encounter (HOSPITAL_BASED_OUTPATIENT_CLINIC_OR_DEPARTMENT_OTHER): Payer: Self-pay | Admitting: Emergency Medicine

## 2023-07-25 ENCOUNTER — Emergency Department (HOSPITAL_BASED_OUTPATIENT_CLINIC_OR_DEPARTMENT_OTHER): Admitting: Radiology

## 2023-07-25 DIAGNOSIS — S4292XA Fracture of left shoulder girdle, part unspecified, initial encounter for closed fracture: Secondary | ICD-10-CM | POA: Diagnosis not present

## 2023-07-25 DIAGNOSIS — S42115A Nondisplaced fracture of body of scapula, left shoulder, initial encounter for closed fracture: Secondary | ICD-10-CM | POA: Diagnosis not present

## 2023-07-25 DIAGNOSIS — M19012 Primary osteoarthritis, left shoulder: Secondary | ICD-10-CM | POA: Diagnosis not present

## 2023-07-25 DIAGNOSIS — Y92410 Unspecified street and highway as the place of occurrence of the external cause: Secondary | ICD-10-CM | POA: Insufficient documentation

## 2023-07-25 DIAGNOSIS — R9431 Abnormal electrocardiogram [ECG] [EKG]: Secondary | ICD-10-CM | POA: Diagnosis not present

## 2023-07-25 DIAGNOSIS — M542 Cervicalgia: Secondary | ICD-10-CM | POA: Diagnosis not present

## 2023-07-25 DIAGNOSIS — R519 Headache, unspecified: Secondary | ICD-10-CM | POA: Diagnosis not present

## 2023-07-25 DIAGNOSIS — M25512 Pain in left shoulder: Secondary | ICD-10-CM | POA: Diagnosis not present

## 2023-07-25 MED ORDER — ONDANSETRON 4 MG PO TBDP: ORAL_TABLET | ORAL | 0 refills | Status: AC

## 2023-07-25 MED ORDER — MORPHINE SULFATE 15 MG PO TABS: 7.5000 mg | ORAL_TABLET | ORAL | 0 refills | Status: AC | PRN

## 2023-07-25 MED ORDER — HYDROCODONE-ACETAMINOPHEN 5-325 MG PO TABS
1.0000 | ORAL_TABLET | Freq: Once | ORAL | Status: AC
  Administered 2023-07-25: 1 via ORAL
  Filled 2023-07-25: qty 1
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
## 2023-07-25 NOTE — Discharge Instructions (Signed)
Follow up with ortho in the office  Take 4 over the counter ibuprofen tablets 3 times a day or 2 over-the-counter naproxen tablets twice a day for pain. Also take tylenol 1000mg(2 extra strength) four times a day.   Then take the pain medicine if you feel like you need it. Narcotics do not help with the pain, they only make you care about it less.  You can become addicted to this, people may break into your house to steal it.  It will constipate you.  If you drive under the influence of this medicine you can get a DUI.    

## 2023-07-26 MED ORDER — ACETAMINOPHEN 325 MG PO TABS
650.0000 mg | ORAL_TABLET | Freq: Once | ORAL | Status: AC
  Administered 2023-07-26: 650 mg via ORAL
  Filled 2023-07-26: qty 2

## 2023-07-26 MED ORDER — OXYCODONE HCL 5 MG PO TABS
5.0000 mg | ORAL_TABLET | Freq: Once | ORAL | Status: AC
  Administered 2023-07-26: 5 mg via ORAL
  Filled 2023-07-26: qty 1

## 2023-07-31 DIAGNOSIS — S42115A Nondisplaced fracture of body of scapula, left shoulder, initial encounter for closed fracture: Secondary | ICD-10-CM | POA: Diagnosis not present

## 2023-08-30 DIAGNOSIS — S42115A Nondisplaced fracture of body of scapula, left shoulder, initial encounter for closed fracture: Secondary | ICD-10-CM | POA: Diagnosis not present

## 2023-08-31 DIAGNOSIS — M6281 Muscle weakness (generalized): Secondary | ICD-10-CM | POA: Diagnosis not present

## 2023-08-31 DIAGNOSIS — M25612 Stiffness of left shoulder, not elsewhere classified: Secondary | ICD-10-CM | POA: Diagnosis not present

## 2023-08-31 DIAGNOSIS — S42115D Nondisplaced fracture of body of scapula, left shoulder, subsequent encounter for fracture with routine healing: Secondary | ICD-10-CM | POA: Diagnosis not present

## 2023-09-12 DIAGNOSIS — D485 Neoplasm of uncertain behavior of skin: Secondary | ICD-10-CM | POA: Diagnosis not present

## 2023-09-12 DIAGNOSIS — D225 Melanocytic nevi of trunk: Secondary | ICD-10-CM | POA: Diagnosis not present

## 2023-09-12 DIAGNOSIS — L820 Inflamed seborrheic keratosis: Secondary | ICD-10-CM | POA: Diagnosis not present

## 2023-09-12 DIAGNOSIS — Z85828 Personal history of other malignant neoplasm of skin: Secondary | ICD-10-CM | POA: Diagnosis not present

## 2023-09-12 DIAGNOSIS — L570 Actinic keratosis: Secondary | ICD-10-CM | POA: Diagnosis not present

## 2023-09-12 DIAGNOSIS — L821 Other seborrheic keratosis: Secondary | ICD-10-CM | POA: Diagnosis not present

## 2023-09-12 DIAGNOSIS — L814 Other melanin hyperpigmentation: Secondary | ICD-10-CM | POA: Diagnosis not present

## 2023-09-12 DIAGNOSIS — L503 Dermatographic urticaria: Secondary | ICD-10-CM | POA: Diagnosis not present

## 2023-09-12 DIAGNOSIS — C4441 Basal cell carcinoma of skin of scalp and neck: Secondary | ICD-10-CM | POA: Diagnosis not present

## 2023-09-25 DIAGNOSIS — C4441 Basal cell carcinoma of skin of scalp and neck: Secondary | ICD-10-CM | POA: Diagnosis not present

## 2023-11-07 DIAGNOSIS — H906 Mixed conductive and sensorineural hearing loss, bilateral: Secondary | ICD-10-CM | POA: Diagnosis not present

## 2023-11-07 DIAGNOSIS — H6121 Impacted cerumen, right ear: Secondary | ICD-10-CM | POA: Diagnosis not present

## 2023-11-07 DIAGNOSIS — Z9089 Acquired absence of other organs: Secondary | ICD-10-CM | POA: Diagnosis not present

## 2023-12-26 DIAGNOSIS — R972 Elevated prostate specific antigen [PSA]: Secondary | ICD-10-CM | POA: Diagnosis not present

## 2024-03-06 DIAGNOSIS — S8002XA Contusion of left knee, initial encounter: Secondary | ICD-10-CM | POA: Diagnosis not present

## 2024-04-25 DIAGNOSIS — L820 Inflamed seborrheic keratosis: Secondary | ICD-10-CM | POA: Diagnosis not present

## 2024-04-25 DIAGNOSIS — I788 Other diseases of capillaries: Secondary | ICD-10-CM | POA: Diagnosis not present

## 2024-04-25 DIAGNOSIS — Z85828 Personal history of other malignant neoplasm of skin: Secondary | ICD-10-CM | POA: Diagnosis not present

## 2024-04-25 DIAGNOSIS — L570 Actinic keratosis: Secondary | ICD-10-CM | POA: Diagnosis not present

## 2024-05-01 DIAGNOSIS — H95191 Other disorders following mastoidectomy, right ear: Secondary | ICD-10-CM | POA: Diagnosis not present

## 2024-05-01 DIAGNOSIS — H6121 Impacted cerumen, right ear: Secondary | ICD-10-CM | POA: Diagnosis not present

## 2024-05-03 DIAGNOSIS — E669 Obesity, unspecified: Secondary | ICD-10-CM | POA: Diagnosis not present

## 2024-05-03 DIAGNOSIS — Z6830 Body mass index (BMI) 30.0-30.9, adult: Secondary | ICD-10-CM | POA: Diagnosis not present

## 2024-05-03 DIAGNOSIS — I10 Essential (primary) hypertension: Secondary | ICD-10-CM | POA: Diagnosis not present
